# Patient Record
Sex: Male | Born: 1950 | Race: Black or African American | Hispanic: No | Marital: Single | Smoking: Current every day smoker
Health system: Southern US, Community
[De-identification: ages and names within clinical notes are randomized; demographics above are authoritative.]

## PROBLEM LIST (undated history)

## (undated) ENCOUNTER — Emergency Department (HOSPITAL_COMMUNITY): Admission: EM | Payer: Medicare Other | Source: Home / Self Care

## (undated) DIAGNOSIS — I10 Essential (primary) hypertension: Secondary | ICD-10-CM

## (undated) DIAGNOSIS — S31139A Puncture wound of abdominal wall without foreign body, unspecified quadrant without penetration into peritoneal cavity, initial encounter: Secondary | ICD-10-CM

## (undated) DIAGNOSIS — R42 Dizziness and giddiness: Secondary | ICD-10-CM

## (undated) DIAGNOSIS — T4145XA Adverse effect of unspecified anesthetic, initial encounter: Secondary | ICD-10-CM

## (undated) DIAGNOSIS — I2510 Atherosclerotic heart disease of native coronary artery without angina pectoris: Secondary | ICD-10-CM

## (undated) DIAGNOSIS — E785 Hyperlipidemia, unspecified: Secondary | ICD-10-CM

## (undated) DIAGNOSIS — Z720 Tobacco use: Secondary | ICD-10-CM

## (undated) DIAGNOSIS — T8859XA Other complications of anesthesia, initial encounter: Secondary | ICD-10-CM

## (undated) DIAGNOSIS — Z9889 Other specified postprocedural states: Secondary | ICD-10-CM

## (undated) DIAGNOSIS — F0390 Unspecified dementia without behavioral disturbance: Secondary | ICD-10-CM

## (undated) DIAGNOSIS — R0602 Shortness of breath: Secondary | ICD-10-CM

## (undated) DIAGNOSIS — I252 Old myocardial infarction: Secondary | ICD-10-CM

## (undated) DIAGNOSIS — W3400XA Accidental discharge from unspecified firearms or gun, initial encounter: Secondary | ICD-10-CM

## (undated) DIAGNOSIS — R112 Nausea with vomiting, unspecified: Secondary | ICD-10-CM

## (undated) HISTORY — DX: Dizziness and giddiness: R42

## (undated) HISTORY — DX: Unspecified dementia, unspecified severity, without behavioral disturbance, psychotic disturbance, mood disturbance, and anxiety: F03.90

## (undated) HISTORY — DX: Tobacco use: Z72.0

## (undated) HISTORY — DX: Old myocardial infarction: I25.2

## (undated) HISTORY — DX: Hyperlipidemia, unspecified: E78.5

## (undated) HISTORY — DX: Accidental discharge from unspecified firearms or gun, initial encounter: W34.00XA

## (undated) HISTORY — DX: Puncture wound of abdominal wall without foreign body, unspecified quadrant without penetration into peritoneal cavity, initial encounter: S31.139A

---

## 1971-08-07 HISTORY — PX: ABDOMINAL SURGERY: SHX537

## 1976-08-06 HISTORY — PX: TIBIA FRACTURE SURGERY: SHX806

## 1997-12-17 ENCOUNTER — Other Ambulatory Visit: Admission: RE | Admit: 1997-12-17 | Discharge: 1997-12-17 | Payer: Self-pay

## 1997-12-31 ENCOUNTER — Emergency Department (HOSPITAL_COMMUNITY): Admission: EM | Admit: 1997-12-31 | Discharge: 1997-12-31 | Payer: Self-pay | Admitting: Emergency Medicine

## 1998-01-27 ENCOUNTER — Emergency Department (HOSPITAL_COMMUNITY): Admission: EM | Admit: 1998-01-27 | Discharge: 1998-01-27 | Payer: Self-pay | Admitting: Emergency Medicine

## 1998-06-13 ENCOUNTER — Emergency Department (HOSPITAL_COMMUNITY): Admission: EM | Admit: 1998-06-13 | Discharge: 1998-06-13 | Payer: Self-pay | Admitting: Emergency Medicine

## 1998-06-27 ENCOUNTER — Encounter: Admission: RE | Admit: 1998-06-27 | Discharge: 1998-06-27 | Payer: Self-pay | Admitting: Internal Medicine

## 1999-09-26 ENCOUNTER — Encounter: Admission: RE | Admit: 1999-09-26 | Discharge: 1999-09-26 | Payer: Self-pay | Admitting: Internal Medicine

## 2000-03-03 ENCOUNTER — Emergency Department (HOSPITAL_COMMUNITY): Admission: EM | Admit: 2000-03-03 | Discharge: 2000-03-03 | Payer: Self-pay | Admitting: Emergency Medicine

## 2000-03-04 ENCOUNTER — Encounter: Admission: RE | Admit: 2000-03-04 | Discharge: 2000-03-04 | Payer: Self-pay | Admitting: Internal Medicine

## 2000-08-22 ENCOUNTER — Encounter: Admission: RE | Admit: 2000-08-22 | Discharge: 2000-08-22 | Payer: Self-pay | Admitting: Hematology and Oncology

## 2000-08-22 ENCOUNTER — Ambulatory Visit (HOSPITAL_COMMUNITY): Admission: RE | Admit: 2000-08-22 | Discharge: 2000-08-22 | Payer: Self-pay | Admitting: Hematology and Oncology

## 2002-01-19 ENCOUNTER — Ambulatory Visit (HOSPITAL_COMMUNITY): Admission: RE | Admit: 2002-01-19 | Discharge: 2002-01-19 | Payer: Self-pay | Admitting: Family Medicine

## 2002-01-19 ENCOUNTER — Encounter: Payer: Self-pay | Admitting: Family Medicine

## 2003-04-15 ENCOUNTER — Emergency Department (HOSPITAL_COMMUNITY): Admission: EM | Admit: 2003-04-15 | Discharge: 2003-04-15 | Payer: Self-pay | Admitting: Emergency Medicine

## 2003-09-29 ENCOUNTER — Ambulatory Visit (HOSPITAL_COMMUNITY): Admission: RE | Admit: 2003-09-29 | Discharge: 2003-09-29 | Payer: Self-pay | Admitting: Family Medicine

## 2003-10-11 ENCOUNTER — Emergency Department (HOSPITAL_COMMUNITY): Admission: EM | Admit: 2003-10-11 | Discharge: 2003-10-11 | Payer: Self-pay | Admitting: Emergency Medicine

## 2004-05-05 ENCOUNTER — Ambulatory Visit: Payer: Self-pay | Admitting: Family Medicine

## 2004-11-30 IMAGING — CR DG CHEST 2V
2 series · 2 of 2 positions shown · non-contrast
Comparison: none

CLINICAL DATA: Chest pain.  
 CHEST, TWO VIEWS 
 PA and lateral views of the chest dated 10/11/03 are compared with the prior study of 09/29/03.  The heart remains normal in size.  The pulmonary vascularity is felt to be within normal limits, and the lung fields are clear.  Slight eventration of the right hemidiaphragm is suggested.

[view not recorded (1 of 2)]
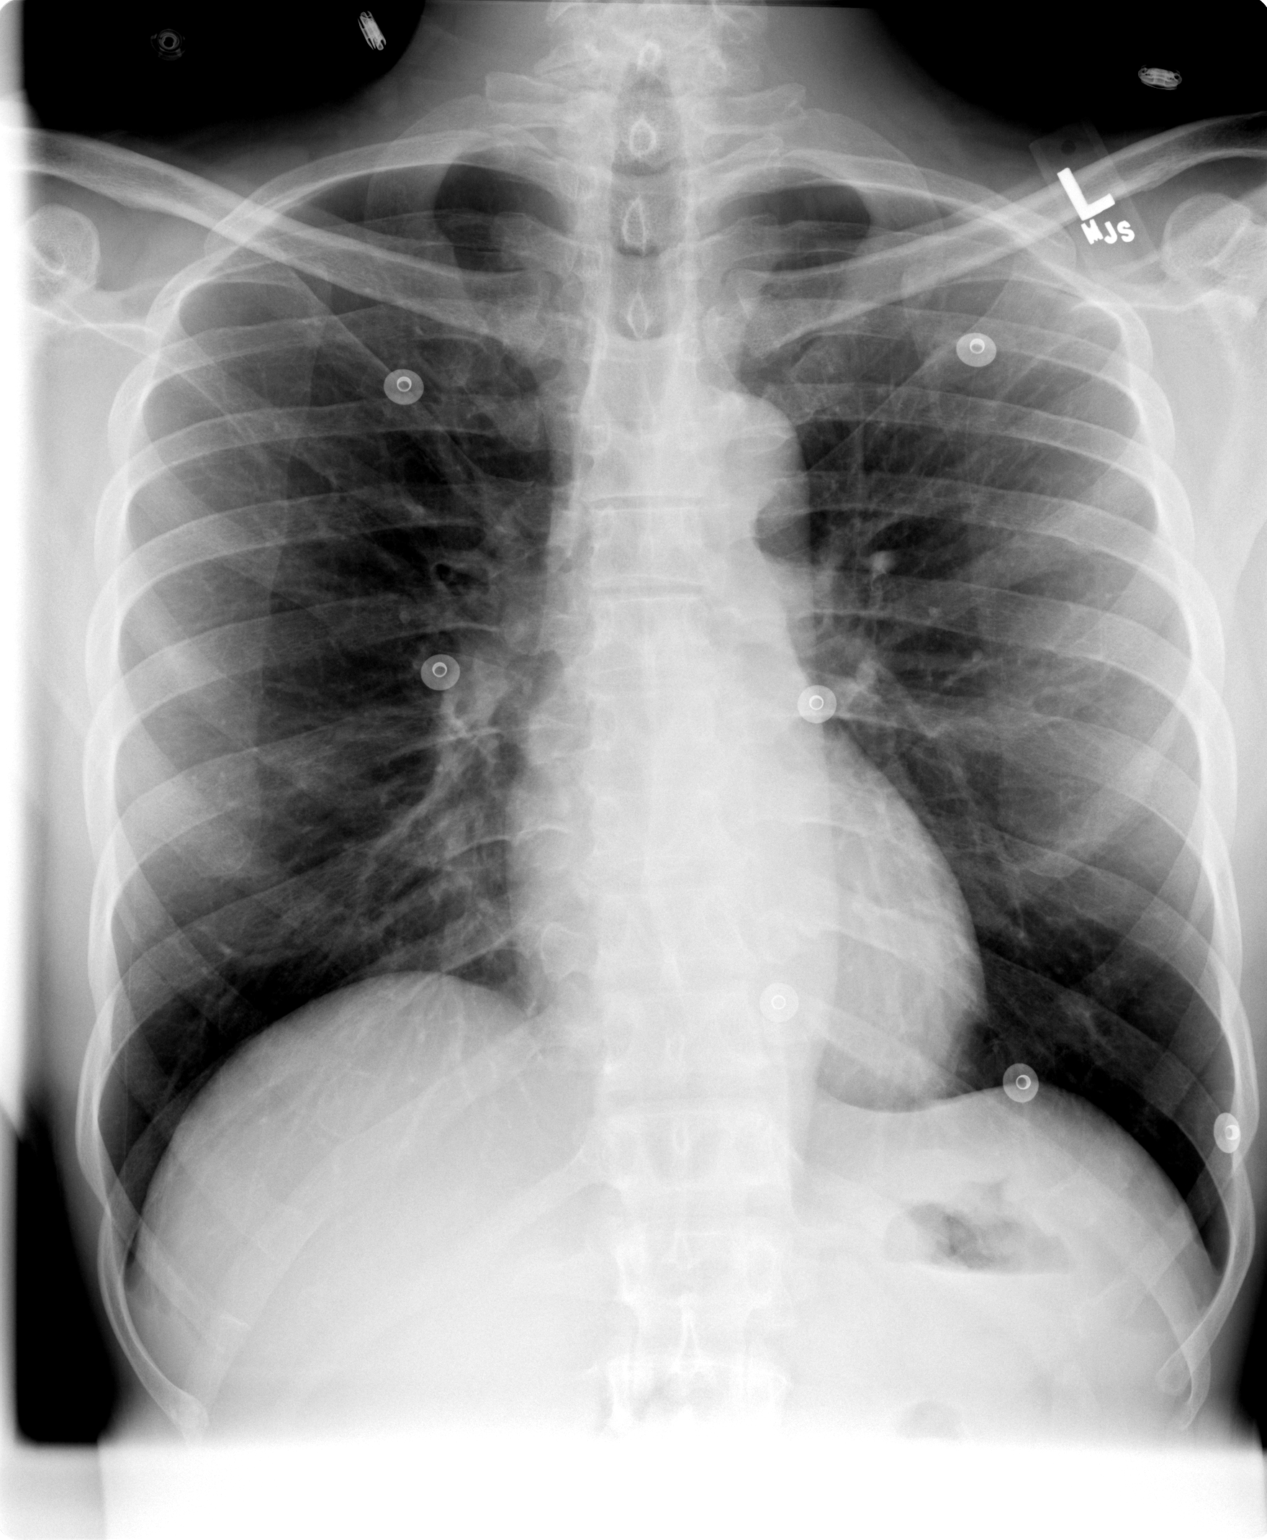

[view not recorded (2 of 2)]
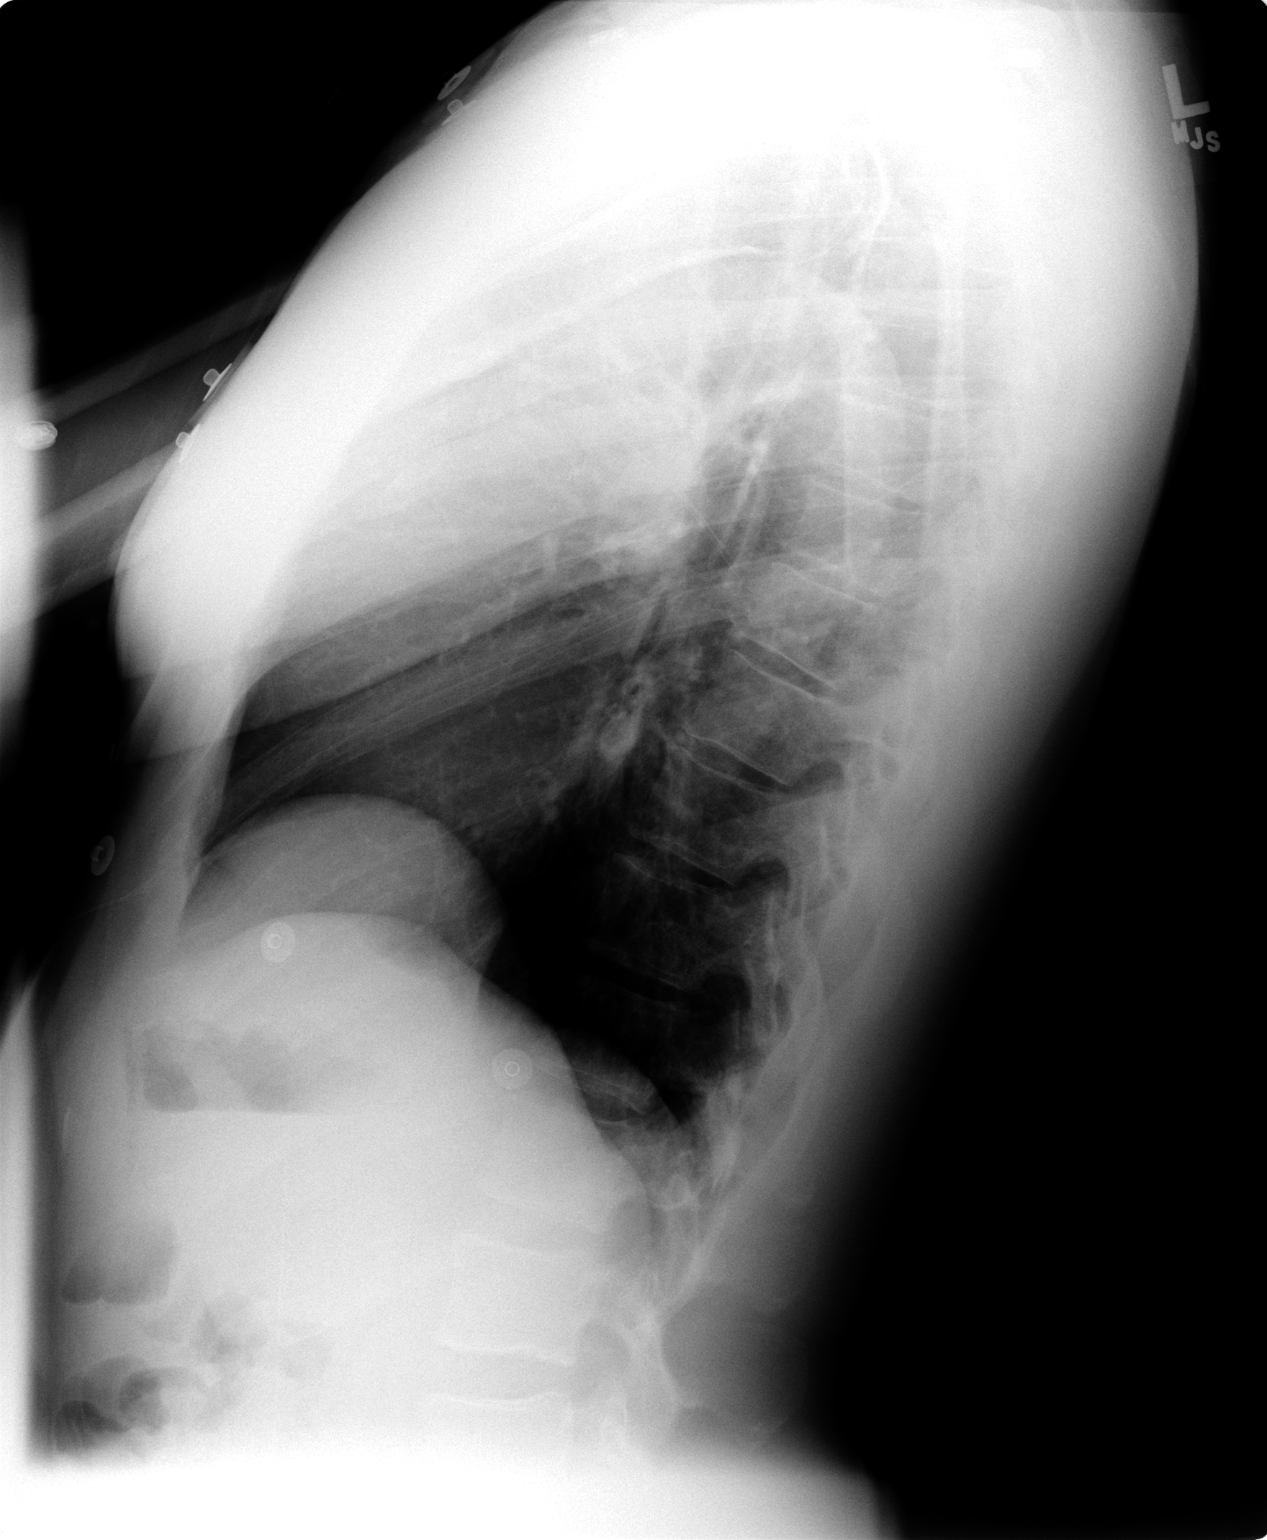

[2 of 2 positions shown; findings below may reference images not displayed]

IMPRESSION: Negative chest for active disease without significant interval change since the prior study.

## 2004-12-07 ENCOUNTER — Ambulatory Visit: Payer: Self-pay | Admitting: Family Medicine

## 2004-12-13 ENCOUNTER — Ambulatory Visit (HOSPITAL_COMMUNITY): Admission: RE | Admit: 2004-12-13 | Discharge: 2004-12-13 | Payer: Self-pay | Admitting: Family Medicine

## 2005-04-20 ENCOUNTER — Ambulatory Visit: Payer: Self-pay | Admitting: Family Medicine

## 2005-04-27 ENCOUNTER — Ambulatory Visit: Payer: Self-pay | Admitting: Family Medicine

## 2005-05-01 ENCOUNTER — Ambulatory Visit: Payer: Self-pay | Admitting: Family Medicine

## 2006-03-05 ENCOUNTER — Ambulatory Visit: Payer: Self-pay | Admitting: Family Medicine

## 2006-03-06 ENCOUNTER — Ambulatory Visit (HOSPITAL_COMMUNITY): Admission: RE | Admit: 2006-03-06 | Discharge: 2006-03-06 | Payer: Self-pay | Admitting: Family Medicine

## 2006-03-19 ENCOUNTER — Ambulatory Visit: Payer: Self-pay | Admitting: Family Medicine

## 2007-10-05 DIAGNOSIS — I252 Old myocardial infarction: Secondary | ICD-10-CM

## 2007-10-05 HISTORY — PX: CORONARY ANGIOPLASTY WITH STENT PLACEMENT: SHX49

## 2007-10-05 HISTORY — DX: Old myocardial infarction: I25.2

## 2007-10-23 ENCOUNTER — Inpatient Hospital Stay (HOSPITAL_COMMUNITY): Admission: AD | Admit: 2007-10-23 | Discharge: 2007-10-27 | Payer: Self-pay | Admitting: Cardiology

## 2007-10-24 ENCOUNTER — Encounter: Payer: Self-pay | Admitting: Cardiology

## 2007-10-24 ENCOUNTER — Encounter: Payer: Self-pay | Admitting: Cardiothoracic Surgery

## 2007-10-27 ENCOUNTER — Encounter: Payer: Self-pay | Admitting: Cardiothoracic Surgery

## 2007-10-27 ENCOUNTER — Ambulatory Visit: Payer: Self-pay | Admitting: Cardiothoracic Surgery

## 2007-12-04 ENCOUNTER — Ambulatory Visit: Payer: Self-pay | Admitting: Cardiothoracic Surgery

## 2007-12-05 HISTORY — PX: CORONARY ARTERY BYPASS GRAFT: SHX141

## 2007-12-11 ENCOUNTER — Ambulatory Visit (HOSPITAL_COMMUNITY): Admission: RE | Admit: 2007-12-11 | Discharge: 2007-12-11 | Payer: Self-pay | Admitting: Cardiothoracic Surgery

## 2007-12-17 ENCOUNTER — Inpatient Hospital Stay (HOSPITAL_COMMUNITY): Admission: RE | Admit: 2007-12-17 | Discharge: 2007-12-21 | Payer: Self-pay | Admitting: Cardiothoracic Surgery

## 2007-12-17 ENCOUNTER — Ambulatory Visit: Payer: Self-pay | Admitting: Cardiothoracic Surgery

## 2007-12-30 ENCOUNTER — Emergency Department (HOSPITAL_COMMUNITY): Admission: EM | Admit: 2007-12-30 | Discharge: 2007-12-30 | Payer: Self-pay | Admitting: Emergency Medicine

## 2008-03-04 ENCOUNTER — Encounter: Admission: RE | Admit: 2008-03-04 | Discharge: 2008-03-04 | Payer: Self-pay | Admitting: Cardiothoracic Surgery

## 2008-03-04 ENCOUNTER — Ambulatory Visit: Payer: Self-pay | Admitting: Cardiothoracic Surgery

## 2009-02-05 IMAGING — CR DG CHEST 1V PORT
1 series · 1 of 1 positions shown · non-contrast
Comparison: 12/15/2007 preoperative CXR

CLINICAL DATA: CAD.  CABG.

PORTABLE CHEST - 1 VIEW

[view not recorded]
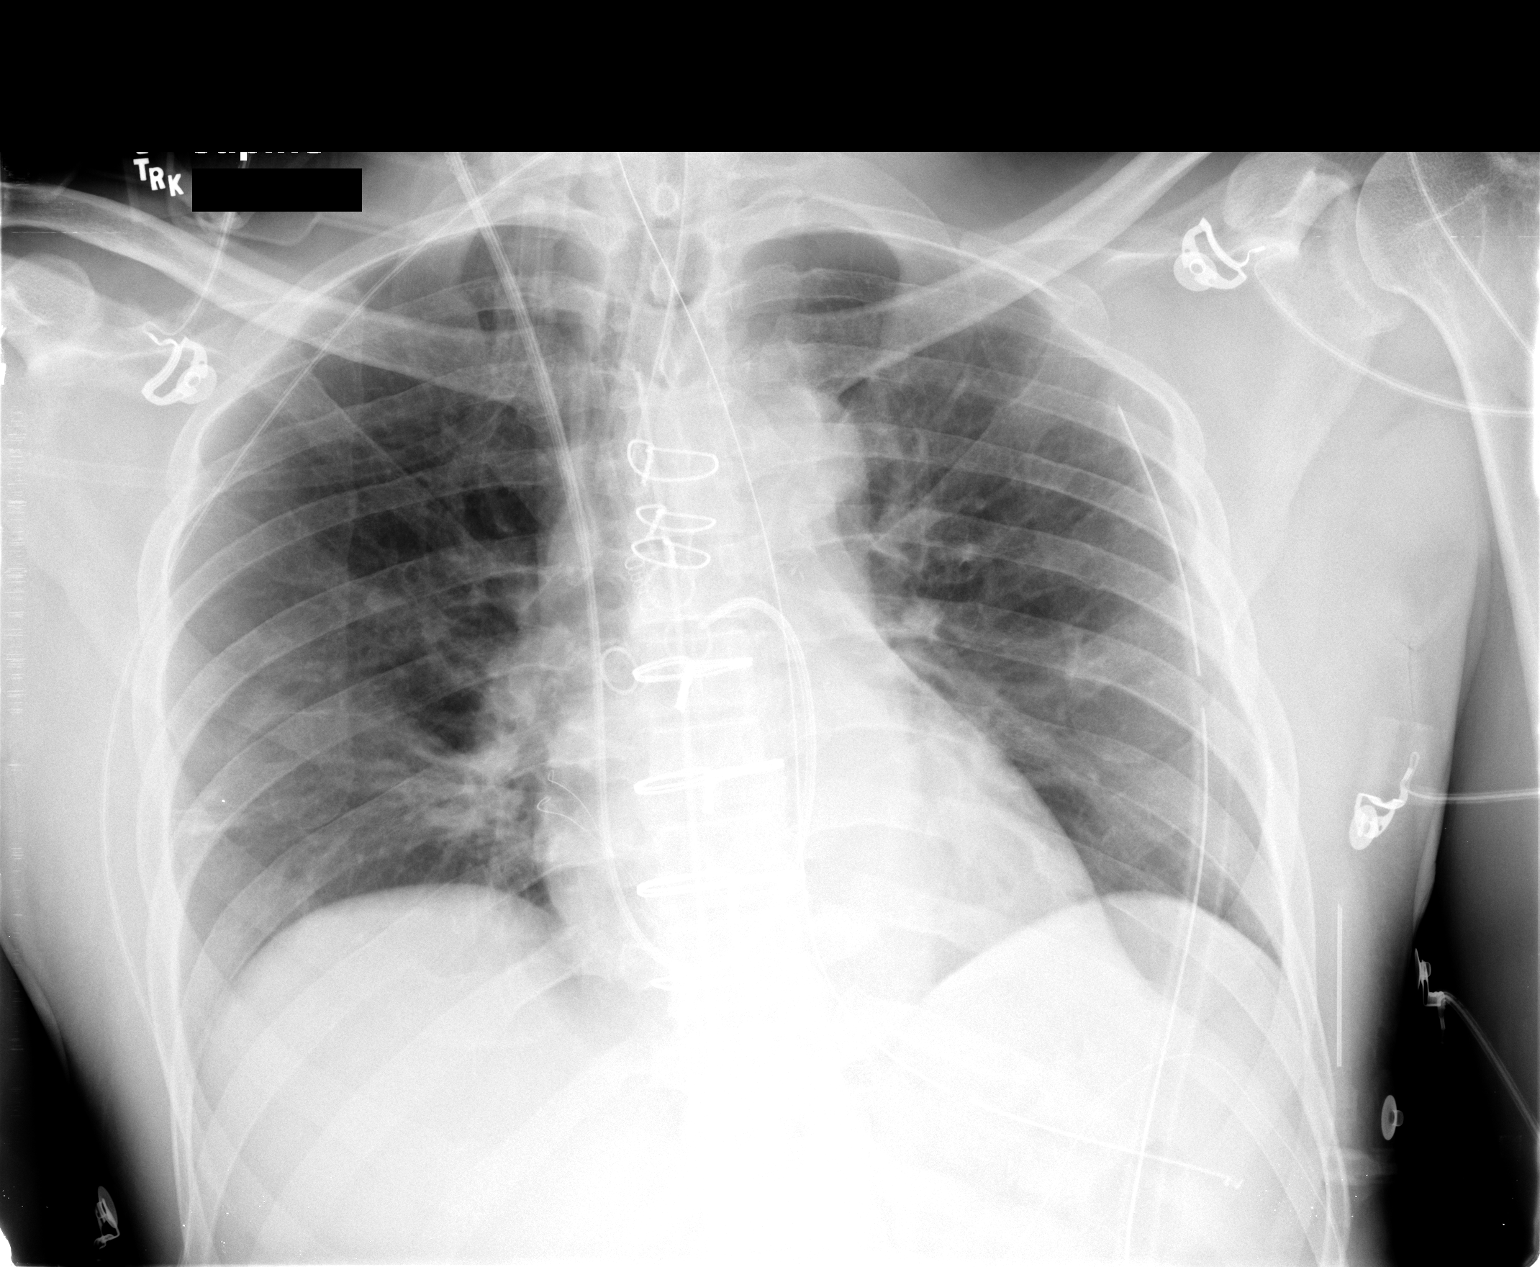

[1 of 1 positions shown; findings below may reference images not displayed]

FINDINGS: Sternal wire sutures and mediastinal clips.  Satisfactory
ET tube position.  SGC enters via right jugular vein approach.  The
tip of the catheter is in the right main pulmonary artery.
Mediastinal and left pleural chest tubes in situ.  NG tube tip is
in the fundus of the stomach.  Subsegmental atelectasis in the
lower lung zones.  No pneumothorax.  Normal cardiomediastinal
silhouette for this single view.
IMPRESSION: Satisfactory post CABG CXR.  Mild subsegmental atelectasis.

## 2009-02-08 IMAGING — CR DG CHEST 2V
2 series · 2 of 2 positions shown · non-contrast
Comparison: 12/19/2007

CLINICAL DATA: Post CABG

CHEST - 2 VIEW

[w chest pa]
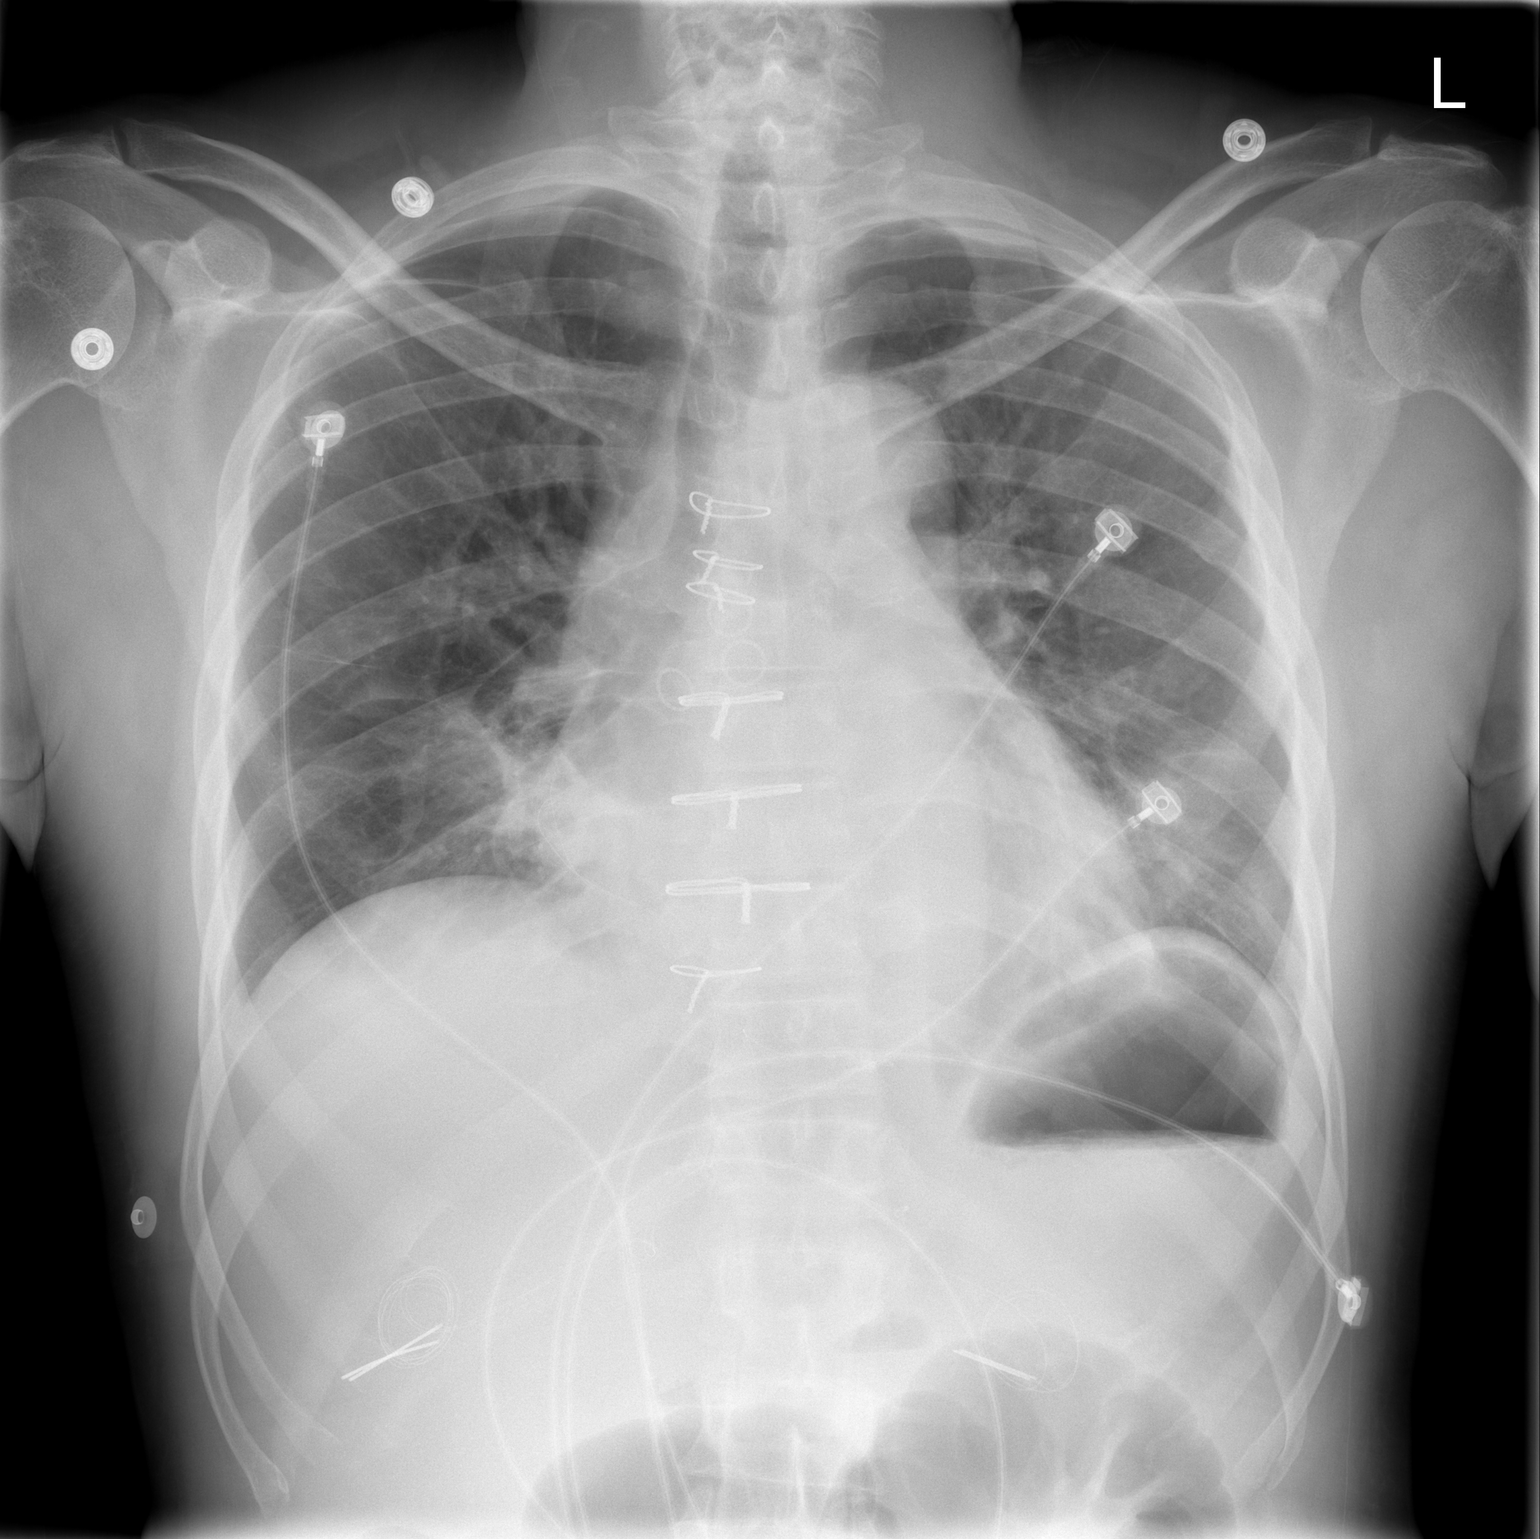

[w chest lat]
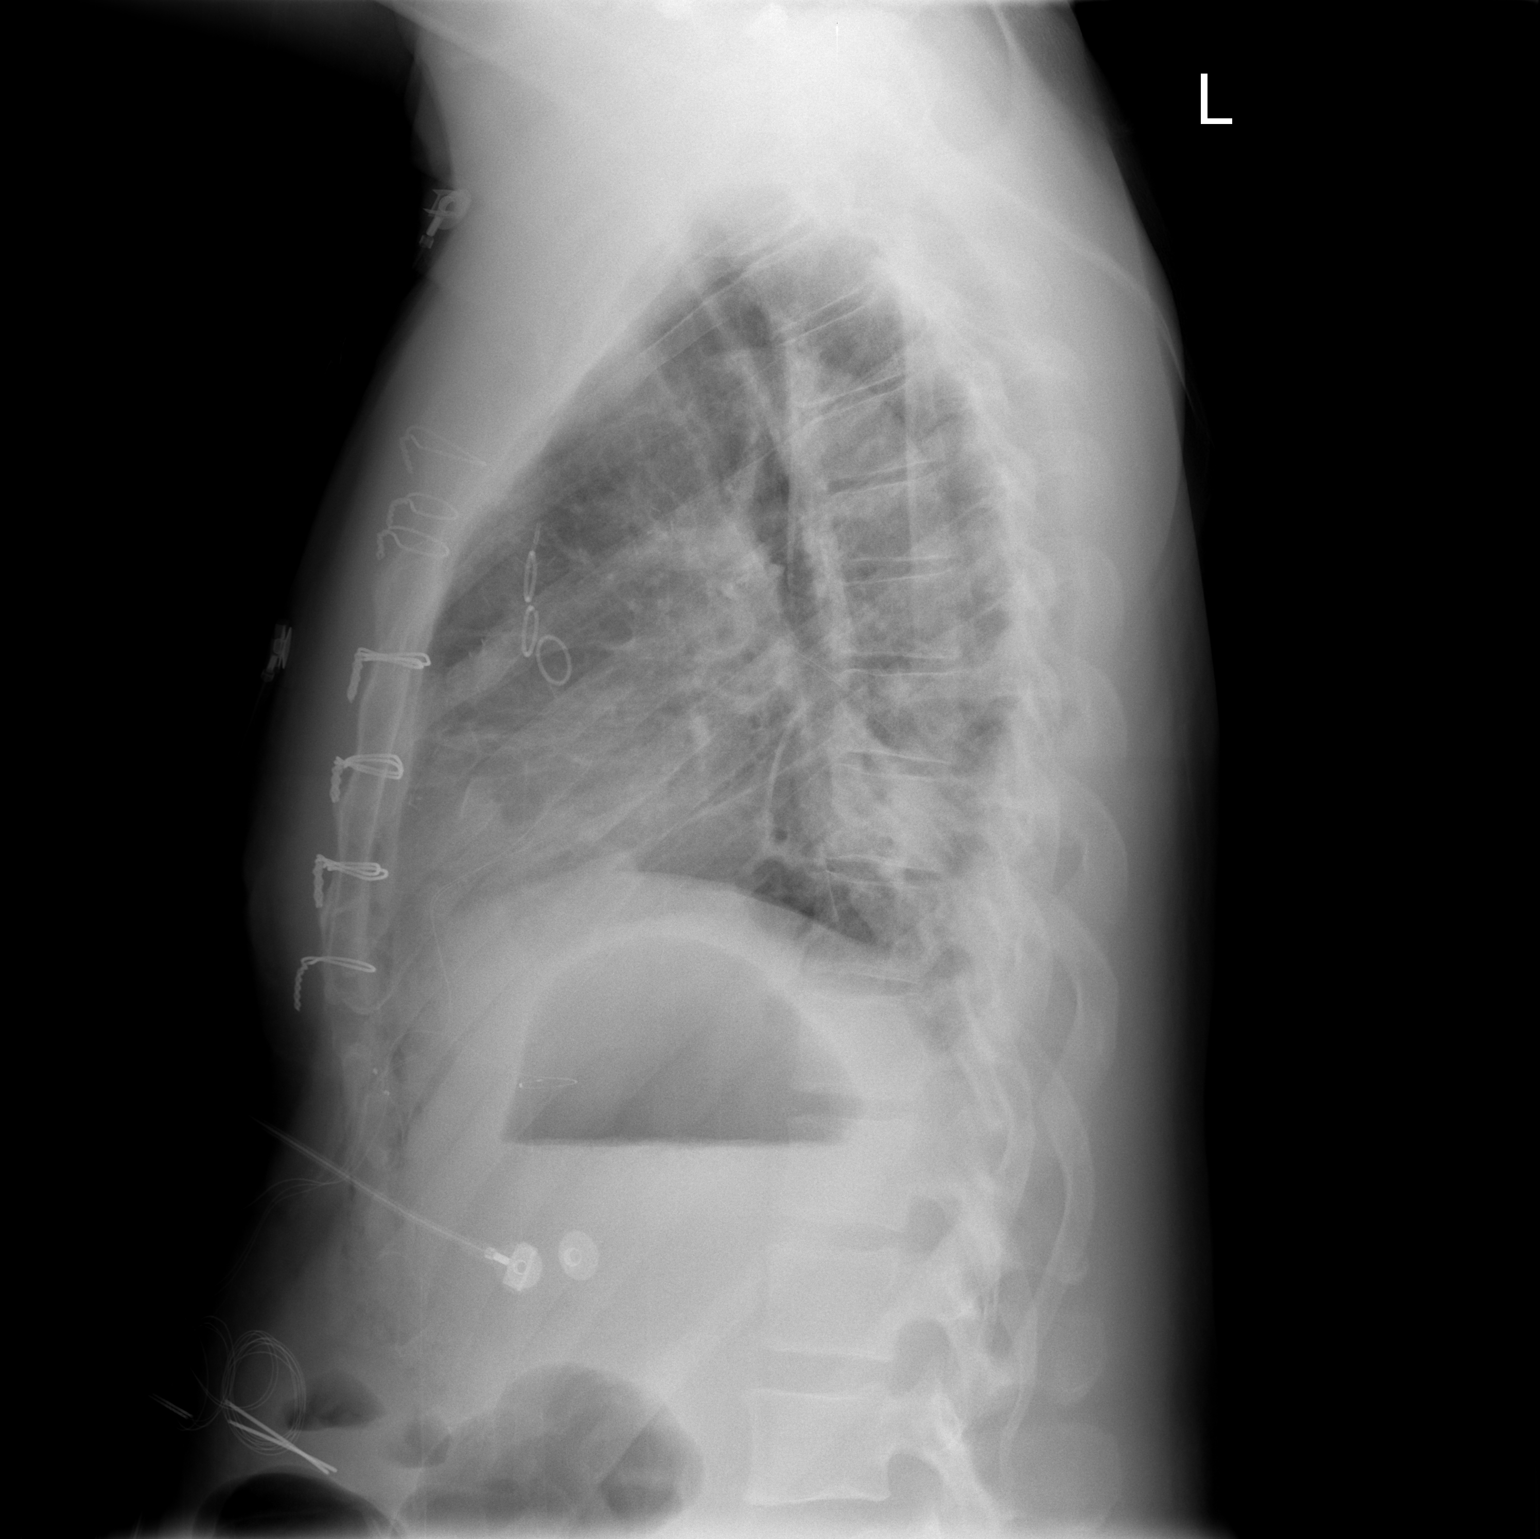

[2 of 2 positions shown; findings below may reference images not displayed]

FINDINGS: Right subclavian sheath has been pulled in the interval.
The left chest tube is now out.  There is no evidence for
pneumothorax.  Atelectasis is seen in the lung bases bilaterally.
Lungs are better expanded than on the prior film.  No substantial
pleural effusion.
IMPRESSION: Cardiomegaly with bibasilar atelectasis.

Improved lung expansion since yesterday's film.

## 2009-09-12 ENCOUNTER — Emergency Department (HOSPITAL_COMMUNITY): Admission: EM | Admit: 2009-09-12 | Discharge: 2009-09-12 | Payer: Self-pay | Admitting: Emergency Medicine

## 2010-03-19 ENCOUNTER — Emergency Department (HOSPITAL_COMMUNITY): Admission: EM | Admit: 2010-03-19 | Discharge: 2010-03-19 | Payer: Self-pay | Admitting: Emergency Medicine

## 2010-08-27 ENCOUNTER — Encounter: Payer: Self-pay | Admitting: Cardiothoracic Surgery

## 2010-10-22 ENCOUNTER — Emergency Department (HOSPITAL_COMMUNITY): Payer: PRIVATE HEALTH INSURANCE

## 2010-10-22 ENCOUNTER — Emergency Department (HOSPITAL_COMMUNITY)
Admission: EM | Admit: 2010-10-22 | Discharge: 2010-10-22 | Disposition: A | Discharge status: Home / Self Care | Payer: PRIVATE HEALTH INSURANCE | Attending: Emergency Medicine | Admitting: Emergency Medicine

## 2010-10-22 DIAGNOSIS — E785 Hyperlipidemia, unspecified: Secondary | ICD-10-CM | POA: Insufficient documentation

## 2010-10-22 DIAGNOSIS — R0789 Other chest pain: Secondary | ICD-10-CM | POA: Insufficient documentation

## 2010-10-22 DIAGNOSIS — Z951 Presence of aortocoronary bypass graft: Secondary | ICD-10-CM | POA: Insufficient documentation

## 2010-10-22 DIAGNOSIS — E119 Type 2 diabetes mellitus without complications: Secondary | ICD-10-CM | POA: Insufficient documentation

## 2010-10-22 DIAGNOSIS — I10 Essential (primary) hypertension: Secondary | ICD-10-CM | POA: Insufficient documentation

## 2010-10-22 DIAGNOSIS — I440 Atrioventricular block, first degree: Secondary | ICD-10-CM | POA: Insufficient documentation

## 2010-10-22 DIAGNOSIS — I2510 Atherosclerotic heart disease of native coronary artery without angina pectoris: Secondary | ICD-10-CM | POA: Insufficient documentation

## 2010-10-22 LAB — POCT CARDIAC MARKERS
Myoglobin, poc: 188 ng/mL (ref 12–200)
Troponin i, poc: <0.05 ng/mL (ref 0.00–0.09)

## 2010-10-22 LAB — BASIC METABOLIC PANEL
Calcium: 8.5 mg/dL (ref 8.4–10.5)
Chloride: 106 mEq/L (ref 96–112)
Sodium: 137 mEq/L (ref 135–145)

## 2010-10-22 LAB — CBC
Hemoglobin: 14.4 g/dL (ref 13.0–17.0)
MCH: 31.4 pg (ref 26.0–34.0)
Platelets: 157 10*3/uL (ref 150–400)
RBC: 4.58 MIL/uL (ref 4.22–5.81)
RDW: 13.5 % (ref 11.5–15.5)
WBC: 4.7 10*3/uL (ref 4.0–10.5)

## 2010-10-22 LAB — RAPID URINE DRUG SCREEN, HOSP PERFORMED
Benzodiazepines: NOT DETECTED
Opiates: NOT DETECTED
Tetrahydrocannabinol: POSITIVE — AB
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
## 2010-12-19 NOTE — Op Note (Signed)
NAMEKELTIN, BAIRD NO.:  [id number]   MEDICAL RECORD NO.:  [id number]          PATIENT TYPE:  INP   LOCATION:  2016                         FACILITY:  [facility]   PHYSICIAN:  [name], MD    DATE OF BIRTH:  November 13, 1950   DATE OF PROCEDURE:  12/17/2007  DATE OF DISCHARGE:                               OPERATIVE REPORT   PREOPERATIVE DIAGNOSES:  Coronary occlusive disease with history of a  bare metal stent placed in the mid right coronary artery for acute  myocardial infarction with residual disease.   POSTOPERATIVE DIAGNOSES:  Coronary occlusive disease with history of a  bare metal stent placed in the mid right coronary artery for acute  myocardial infarction with residual disease.   SURGICAL PROCEDURES:  Coronary artery bypass grafting x4 with the left  internal mammary to the left anterior descending coronary artery,  reverse saphenous vein graft to the diagonal coronary artery, reverse  saphenous vein graft to the circumflex coronary artery, reverse  saphenous vein graft to the posterior descending coronary artery with  Endovein harvesting both thighs.   SURGEON:  Dr. Tyrone Sage.   FIRST ASSISTANT:  Lin Landsman, PA.   BRIEF HISTORY:  The patient is a 61 year old male who 5-6 weeks  preoperatively presented with an acute inferior myocardial infarction.  He underwent acute angioplasty and placed in a bare metal stent by Dr.  Peter Swaziland.  He was started on Plavix, which was maintained for 1-  month.  At the time of catheterization, he was also noted to have an 80%  mid posterior descending coronary artery lesion.  In addition, a very  high-grade, greater than 80% stenosis of the mid LAD involving the  diagonal.  He also had a 70% circumflex lesion.  Because of the nature  of his three-vessel coronary artery disease, he was seen as an  outpatient and encouraged to stop smoking, and then elective coronary  artery bypass grafting was recommended  after a safe period to stop his  Plavix because of the recently placed bare metal stent.  The patient  agreed and signed informed consent.   DESCRIPTION OF PROCEDURE:  With Swan-Ganz and arterial line monitors in  place, the patient underwent general endotracheal anesthesia without  incident.  The skin of the chest and legs were prepped with Betadine and  draped in the usual sterile manner.  Using the [employer] Endovein  harvesting system, vein was harvested from the right thigh.  At the  knee, the vein became small.  Additional vein was harvested from the  left thigh with the [employer] Endovein harvest system and was also of good  quality and caliber.  A median sternotomy was performed.  The left  internal mammary artery was dissected down as a pedicle graft.  The  distal artery was divided and had good free flow.  The pericardium was  opened.  Overall ventricular function appeared preserved.  He was  systemically heparinized.  The ascending aorta was cannulated and aortic  root.  Cardioplegia was introduced into the ascending aorta.  The  patient was placed on  cardiopulmonary bypass at 2.4 liters per minute  per meter squared.  Sites for anastomosis were selected and dissected  out of the epicardium.  The patient's body temperature was cooled to 30  degrees.  Aortic crossclamp was applied.  Five-hundred mL of cold blood  potassium cardioplegia was administered antegrade into the aortic root  with diastolic arrest of the heart.  Myocardial septal temperatures were  monitored throughout the crossclamp.  Attention was turned first to the  posterior descending coronary artery.  In the midportion of the vessel,  I passed a significant amount of calcification.  Then the proximal  posterior descending vessel was opened and admitted a 1.5 mm probe  distally.  Using a running 7-0 Prolene, a distal anastomosis was  performed to the posterior descending with a second reverse saphenous  vein graft.   The heart was elevated and the distal circumflex was  identified and the vessel was opened and admitted a 1.5-mm probe.  Using  a running 7-0 Prolene, distal anastomosis was performed with segment of  reversed saphenous vein graft.  The diagonal coronary artery was then  opened.  It was slightly smaller, admitted a 1.3-1.4 mm in size.  Using  a running 7-0 Prolene, distal anastomosis was performed with a segment  of reversed saphenous vein graft.  Attention was then turned to left  anterior descending coronary artery which was opened in the midportion.  Using a running 8-0 Prolene, the left internal mammary artery was  anastomosed to the left anterior descending coronary artery.  With  release of the bulldog on the mammary artery, there was rise in  myocardial septal temperature.  A bulldog was placed back on the mammary  artery.  Additional cold blood cardioplegia was administered with the  crossclamp still in place.  Three punch aortotomies were performed on  the ascending aorta and each of three vein grafts were anastomosed to  the ascending aorta.  Air was evacuated from the grafts and the aortic  crossclamp was removed.  Total crossclamp time was 100 minutes.  The  patient spontaneously converted to a sinus rhythm.  The sites of  anastomosis were inspected and were free of bleeding.  The patient was  then ventilated and weaned from cardiopulmonary bypass without  difficulty.  Total pump time was 130 minutes.  He was decannulated in  the usual fashion.  Protamine sulfate was administered with operative  field hemostatic.  Two atrial and two ventricular pacing wires applied.  Graft marks were applied.  The left pleural tube was placed.  The  pericardium was reapproximated.  The sternum closed with #6 stainless  steel wire over a single mediastinal Blake drain.  The fascia was closed  with interrupted 0-Vicryl, running 3-0 Vicryl in the subcutaneous  tissue, 4-0 subcuticular stitch in  the skin edges.  Dry dressings were  applied.  Sponge and needle count was reported as correct at the  completion of the procedure.  The patient tolerated the procedure  without obvious complication and was transferred to surgical intensive  care unit for further postoperative care.      [name], MD  Electronically Signed     EG/MEDQ  D:  12/19/2007  T:  12/19/2007  Job:  [id number]   cc:   [name], M.D.

## 2010-12-19 NOTE — Assessment & Plan Note (Signed)
OFFICE VISIT   OTHELLO, DICKENSON  DOB:  1951-01-27                                        March 04, 2008  CHART #:  [id number]   HISTORY OF PRESENT ILLNESS:  The patient is status post coronary artery  bypass grafting x4 done by Dr. Tyrone Sage on Dec 17, 2007.  The patient's  postoperative course is pretty much stable, but the patient signed out  AMA on Dec 21, 2007.  He did receive instructions and discharge  medications at the time of sign out.  The patient presents today for his  first followup visit since surgery, Dec 17, 2007.  The patient states he  is feeling better.  He is up ambulating with a cane at least twice a day  around his house.  He then states that his incisional pain has improved.  He denies any shortness of breath.  The patient states that a nurse  comes out once a week and was told that he needed to stop his  simvastatin due to it was not helping him.  The patient has not seen Dr.  Swaziland postop.  He denies any shortness of breath and opening or  drainage from any of his incision sites.   PHYSICAL EXAMINATION:  VITAL SIGNS:  Blood pressure of 150/108, pulse of  88, respirations of 18, and O2 sat 98% on room air.  GENERAL:  The patient is ambulating with a cane.  He is doing much  better at this time than he was preoperatively, which he was in a  wheelchair last time he was in the office.  RESPIRATORY:  Clear to auscultation bilaterally.  CARDIAC:  Regular rate and rhythm.  Sternum is stable.  ABDOMEN:  Benign.  EXTREMITIES:  No edema noted.  Warm and well perfused.  Incisions are  healing well.   STUDIES:  The patient had a PA and lateral chest x-ray done today, March 04, 2008, which is clear.  She has no signs of effusion, atelectasis, or  pneumothorax.   IMPRESSION AND PLAN:  The patient is status post coronary artery bypass  grafting done by Dr. Tyrone Sage on Dec 17, 2007.  The patient is  progressing well except for hypertension.  He  brought 2 pieces of paper  stating that [facility] nurse had been in to evaluate him and multiple times  blood pressure taken and noted to be in the 160s to 180s systolic with a  diastolic of 191Y to 120s.  We contacted Dr. Elvis Coil office, scheduled  an appointment with him for the patient for tomorrow, but it appears  that an appointment has ready been scheduled and the patient is to see  Dr. Swaziland in the a.m. at 11 o'clock.  The patient is told to continue  ambulating as tolerated 3 to 4 times per day using his cane.  He is told  to slowly increase his weightbearing activity.  We will keep the patient  on current medications and Dr. Swaziland to evaluate increase in blood  pressure medications in the a.m.  We will release the patient from the  office and he is told to contact us with any surgical issues.   [name], MD  Electronically Signed   KMD/MEDQ  D:  03/04/2008  T:  03/04/2008  Job:  [id number]   cc:   Theron Arista  M. Swaziland, M.D.  [name], MD
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
## 2010-12-19 NOTE — Cardiovascular Report (Signed)
NAMEATWOOD, ADCOCK NO.:  [id number]   MEDICAL RECORD NO.:  [id number]          PATIENT TYPE:  OIB   LOCATION:  2807                         FACILITY:  [facility]   PHYSICIAN:  [name], M.D.  DATE OF BIRTH:  12/14/1950   DATE OF PROCEDURE:  10/23/2007  DATE OF DISCHARGE:                            CARDIAC CATHETERIZATION   INDICATIONS FOR PROCEDURE:  A 60 year old black male with a history of  hypertension and family history of cardiac disease presents with acute  onset of substernal chest pain today.  ECG shows marked ST elevation in  the inferolateral leads, II, III, AVF, V5 and V6.  He has bradycardia  with second-degree heart block.  The patient has no prior history of  myocardial infarction.   PROCEDURE:  Left heart catheterization, coronary and left ventricular  angiography, intracoronary stenting of the mid right coronary.   ACCESS:  The patient was unable to have a peripheral IV access obtained.  We therefore placed a venous sheath in the right femoral vein by  standard Seldinger technique.  He also had a 6-French arterial sheath  placed in the right femoral artery via the standard Seldinger technique.   EQUIPMENT:  6-French 4 cm right and left Judkins catheter, 6-French  pigtail catheter, 6-French arterial sheath, a 3.0 x 20 mm Maverick  balloon, a 3.5 x 24 mm Liberte stent and a 3.5 x 15 mm Quantum Maverick  balloon.   MEDICATIONS:  Heparin bolus of 4300 units with subsequent ACT of 338.  Nitroglycerin 200 mcg intracoronary x1, Integrilin bolus at 180 mcg/kg  followed by continuous infusion of 2 mcg/kg per minute, Plavix 600 mg  orally.   HEMODYNAMIC DATA:  Aortic pressure is 127/89 with a mean of 106 mmHg.  Left ventricle pressure is 114 with EDP of 6 mmHg.   CONTRAST:  A total of 175 mL of Omnipaque.   ANGIOGRAPHIC DATA:  The left coronary arises and distributes normally.  The left main coronary artery appears normal.   The left  anterior descending artery is a large vessel which extends out  around the apex.  It gives rise to two high diagonal branches.  The  first diagonal branch is small.  The second diagonal branch is moderate  to large.  There is a 90% bifurcation stenosis in the proximal LAD at  the bifurcation and involving the origin of the second diagonal branch.  There is then a focal 70% stenosis in the mid-LAD.   The left circumflex coronary has scattered 40% lesions throughout the  proximal and mid vessel.   The right coronary arises and distributes normally.  It is a dominant  vessel and is occluded in the midvessel with TIMI grade 0 flow.   We proceeded at this point with emergent intervention of the right  coronary artery.  The patient was in second degree heart block during  the initial phase of this procedure.  The patient was hypertensive.  We  did give him intracoronary nitroglycerin x1.  We then crossed the lesion  with a wire without difficulty.  At this point  with reperfusion, the  patient became profoundly hypotensive and further bradycardic with heart  rates down the high 30s.  He responded promptly to IV atropine with  increased heart rate to 110 and stabilization of blood pressure.  We  then proceeded with predilatation of the right coronary lesion in the  mid vessel using a 3.0 x 20-mm Maverick balloon.  This was dilated at 6  and then 8 atmospheres.  We then stented the mid right coronary using a  3.5 x 24 mm Liberte stent deploying it at 9 and then 12 atmospheres with  a stent balloon.  We postdilated the stented segment using a 3.5 x 15 mm  Quantum Maverick balloon dilated up to 16 atmospheres x2.  This yielded  excellent angiographic result with 0% residual stenosis and TIMI grade 3  flow.  It was noted there was an 80-90% stenosis in the proximal PDA.  At this point the patient was pain free and hemodynamically stable.  His  ST elevation had resolved.  He was in sinus rhythm  without any evidence  of AV block.   Left ventricular angiography at this point was performed in the RAO  view.  This demonstrates normal left ventricular size and normal  contractility with ejection fraction of 65%.  There was no regional wall  motion abnormality.  There was no significant mitral insufficiency.   FINAL INTERPRETATION:  1. Severe two-vessel obstructive atherosclerotic coronary disease.  2. Normal left ventricular function.  3. Successful emergent intracoronary stenting of the mid right      coronary with a bare metal stent.           ______________________________  [name], M.D.     PMJ/MEDQ  D:  10/23/2007  T:  10/24/2007  Job:  [id number]   cc:   Tinnie Gens C. Quintella Reichert, M.D.

## 2011-04-20 ENCOUNTER — Other Ambulatory Visit: Payer: Self-pay | Admitting: *Deleted

## 2011-04-23 ENCOUNTER — Other Ambulatory Visit: Payer: Self-pay | Admitting: *Deleted

## 2011-04-23 MED ORDER — CLOPIDOGREL BISULFATE 75 MG PO TABS: 75.0000 mg | ORAL_TABLET | Freq: Every day | ORAL | Status: DC

## 2011-04-24 ENCOUNTER — Other Ambulatory Visit: Payer: Self-pay | Admitting: *Deleted

## 2011-04-24 MED ORDER — CLOPIDOGREL BISULFATE 75 MG PO TABS: 75.0000 mg | ORAL_TABLET | Freq: Every day | ORAL | Status: DC

## 2011-04-24 NOTE — Telephone Encounter (Signed)
Refilled Meds from fax  

## 2011-04-25 ENCOUNTER — Other Ambulatory Visit: Payer: Self-pay | Admitting: *Deleted

## 2011-04-25 MED ORDER — CLOPIDOGREL BISULFATE 75 MG PO TABS: 75.0000 mg | ORAL_TABLET | Freq: Every day | ORAL | Status: DC

## 2011-04-25 NOTE — Telephone Encounter (Signed)
escribe medication per fax request  

## 2011-04-30 LAB — COMPREHENSIVE METABOLIC PANEL
AST: 82 — ABNORMAL HIGH
Albumin: 3.4 — ABNORMAL LOW
Calcium: 8.9
Chloride: 106
Creatinine, Ser: 1
GFR calc Af Amer: >60
Total Bilirubin: 0.9
Total Protein: 6.7

## 2011-04-30 LAB — CBC
HCT: 42.3
Hemoglobin: 14.2
MCHC: 33.6
MCHC: 34.2
Platelets: 216
RDW: 13.9
RDW: 14.1

## 2011-04-30 LAB — HEMOGLOBIN A1C: Mean Plasma Glucose: 122

## 2011-04-30 LAB — I-STAT EC8
Bicarbonate: 20.4
Glucose, Bld: 133 — ABNORMAL HIGH
TCO2: 21
pH, Arterial: 7.386

## 2011-04-30 LAB — CARDIAC PANEL(CRET KIN+CKTOT+MB+TROPI)
CK, MB: 50.5 — ABNORMAL HIGH
CK, MB: 79.7 — ABNORMAL HIGH
Relative Index: 9.8 — ABNORMAL HIGH
Total CK: 456 — ABNORMAL HIGH
Total CK: 608 — ABNORMAL HIGH
Total CK: 810 — ABNORMAL HIGH
Troponin I: 12.34
Troponin I: 27.33
Troponin I: 8.88

## 2011-04-30 LAB — BASIC METABOLIC PANEL
BUN: 9
CO2: 28
Calcium: 9.1
Creatinine, Ser: 0.99
GFR calc non Af Amer: >60
Glucose, Bld: 81

## 2011-04-30 LAB — APTT: aPTT: 33

## 2011-04-30 LAB — LIPID PANEL
Cholesterol: 134
HDL: 30 — ABNORMAL LOW

## 2011-04-30 LAB — PROTIME-INR: INR: 1

## 2011-04-30 LAB — POCT I-STAT CREATININE: Operator id: 118911

## 2011-05-02 LAB — CBC
HCT: 30.9 — ABNORMAL LOW
HCT: 32.5 — ABNORMAL LOW
Hemoglobin: 10.7 — ABNORMAL LOW
Hemoglobin: 11 — ABNORMAL LOW
Hemoglobin: 11.8 — ABNORMAL LOW
MCHC: 33.3
MCHC: 33.8
MCHC: 34.7
MCV: 94.8
MCV: 95.4
Platelets: 72 — ABNORMAL LOW
Platelets: 74 — ABNORMAL LOW
RBC: 3.26 — ABNORMAL LOW
RBC: 3.41 — ABNORMAL LOW
RBC: 3.81 — ABNORMAL LOW
RDW: 14
RDW: 14.4
WBC: 6.4
WBC: 6.7
WBC: 7.4

## 2011-05-02 LAB — POCT CARDIAC MARKERS
CKMB, poc: 1.3
Myoglobin, poc: 125
Myoglobin, poc: >500
Operator id: 114141
Troponin i, poc: <0.05

## 2011-05-02 LAB — BASIC METABOLIC PANEL
BUN: 11
BUN: 8
CO2: 28
CO2: 29
Calcium: 8.4
Calcium: 8.6
Chloride: 103
Chloride: 104
Creatinine, Ser: 1.07
Creatinine, Ser: 1.18
GFR calc Af Amer: >60
GFR calc Af Amer: >60
GFR calc non Af Amer: >60
GFR calc non Af Amer: >60
Glucose, Bld: 111 — ABNORMAL HIGH
Glucose, Bld: 121 — ABNORMAL HIGH
Potassium: 3.9
Potassium: 4
Sodium: 139
Sodium: 142

## 2011-05-02 LAB — URINALYSIS, ROUTINE W REFLEX MICROSCOPIC
Glucose, UA: NEGATIVE
Ketones, ur: 15 — AB
Leukocytes, UA: NEGATIVE
Protein, ur: 100 — AB
Urobilinogen, UA: 1

## 2011-05-02 LAB — URINE MICROSCOPIC-ADD ON

## 2011-05-02 LAB — POCT I-STAT, CHEM 8
Chloride: 102
Creatinine, Ser: 1.5
Hemoglobin: 12.9 — ABNORMAL LOW
Potassium: 4
Sodium: 134 — ABNORMAL LOW

## 2011-05-02 LAB — CULTURE, BLOOD (ROUTINE X 2): Culture: NO GROWTH

## 2011-05-02 LAB — CK TOTAL AND CKMB (NOT AT ARMC): Total CK: 963 — ABNORMAL HIGH

## 2011-06-13 ENCOUNTER — Other Ambulatory Visit: Payer: Self-pay | Admitting: *Deleted

## 2011-06-13 MED ORDER — PRAVASTATIN SODIUM 20 MG PO TABS: 20.0000 mg | ORAL_TABLET | Freq: Every day | ORAL | Status: DC

## 2011-06-13 MED ORDER — RAMIPRIL 5 MG PO TABS: 5.0000 mg | ORAL_TABLET | Freq: Every day | ORAL | Status: DC

## 2011-06-13 MED ORDER — METOPROLOL TARTRATE 50 MG PO TABS: 50.0000 mg | ORAL_TABLET | Freq: Two times a day (BID) | ORAL | Status: DC

## 2011-12-30 ENCOUNTER — Encounter: Payer: Self-pay | Admitting: *Deleted

## 2013-12-12 ENCOUNTER — Emergency Department (HOSPITAL_COMMUNITY)
Admission: EM | Admit: 2013-12-12 | Discharge: 2013-12-12 | Disposition: A | Discharge status: Home / Self Care | Payer: Medicare Other | Attending: Emergency Medicine | Admitting: Emergency Medicine

## 2013-12-12 ENCOUNTER — Encounter (HOSPITAL_COMMUNITY): Payer: Self-pay | Admitting: Emergency Medicine

## 2013-12-12 DIAGNOSIS — Y9389 Activity, other specified: Secondary | ICD-10-CM | POA: Insufficient documentation

## 2013-12-12 DIAGNOSIS — Z951 Presence of aortocoronary bypass graft: Secondary | ICD-10-CM | POA: Insufficient documentation

## 2013-12-12 DIAGNOSIS — I252 Old myocardial infarction: Secondary | ICD-10-CM | POA: Insufficient documentation

## 2013-12-12 DIAGNOSIS — Y929 Unspecified place or not applicable: Secondary | ICD-10-CM | POA: Insufficient documentation

## 2013-12-12 DIAGNOSIS — G8929 Other chronic pain: Secondary | ICD-10-CM | POA: Insufficient documentation

## 2013-12-12 DIAGNOSIS — F0390 Unspecified dementia without behavioral disturbance: Secondary | ICD-10-CM | POA: Insufficient documentation

## 2013-12-12 DIAGNOSIS — W298XXA Contact with other powered powered hand tools and household machinery, initial encounter: Secondary | ICD-10-CM | POA: Insufficient documentation

## 2013-12-12 DIAGNOSIS — S61011A Laceration without foreign body of right thumb without damage to nail, initial encounter: Secondary | ICD-10-CM

## 2013-12-12 DIAGNOSIS — Z9861 Coronary angioplasty status: Secondary | ICD-10-CM | POA: Insufficient documentation

## 2013-12-12 DIAGNOSIS — E785 Hyperlipidemia, unspecified: Secondary | ICD-10-CM | POA: Insufficient documentation

## 2013-12-12 DIAGNOSIS — Z7902 Long term (current) use of antithrombotics/antiplatelets: Secondary | ICD-10-CM | POA: Insufficient documentation

## 2013-12-12 DIAGNOSIS — E1065 Type 1 diabetes mellitus with hyperglycemia: Secondary | ICD-10-CM

## 2013-12-12 DIAGNOSIS — F17200 Nicotine dependence, unspecified, uncomplicated: Secondary | ICD-10-CM | POA: Insufficient documentation

## 2013-12-12 DIAGNOSIS — Z23 Encounter for immunization: Secondary | ICD-10-CM | POA: Insufficient documentation

## 2013-12-12 DIAGNOSIS — S61209A Unspecified open wound of unspecified finger without damage to nail, initial encounter: Secondary | ICD-10-CM | POA: Insufficient documentation

## 2013-12-12 DIAGNOSIS — Z79899 Other long term (current) drug therapy: Secondary | ICD-10-CM | POA: Insufficient documentation

## 2013-12-12 DIAGNOSIS — I10 Essential (primary) hypertension: Secondary | ICD-10-CM | POA: Insufficient documentation

## 2013-12-12 DIAGNOSIS — IMO0002 Reserved for concepts with insufficient information to code with codable children: Secondary | ICD-10-CM | POA: Insufficient documentation

## 2013-12-12 MED ORDER — TETANUS-DIPHTH-ACELL PERTUSSIS 5-2.5-18.5 LF-MCG/0.5 IM SUSP
0.5000 mL | Freq: Once | INTRAMUSCULAR | Status: AC
  Administered 2013-12-12: 0.5 mL via INTRAMUSCULAR
  Filled 2013-12-12: qty 0.5
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
## 2013-12-12 NOTE — ED Notes (Signed)
Pt soaking thumb at present, bleeding controlled.
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
## 2013-12-12 NOTE — ED Notes (Signed)
Approx 2 cm laceration to L thumb from a box cutter at 9:30am Friday morning.  Bleeding controlled pta.  Unknown DT.

## 2013-12-12 NOTE — Discharge Instructions (Signed)
1. Medications: usual home medications 2. Treatment: rest, drink plenty of fluids, keep wound clean with warm soap and water 3. Follow Up: Please followup with your primary doctor for discussion of your diagnoses and further evaluation after today's visit; if you do not have a primary care doctor use the resource guide provided to find one;

## 2013-12-12 NOTE — ED Provider Notes (Signed)
Medical screening examination/treatment/procedure(s) were conducted as a shared visit with non-physician practitioner(s) and myself.  I personally evaluated the patient during the encounter.   Delora Fuel, MD 81/85/63 [id number]

## 2014-03-30 ENCOUNTER — Observation Stay (HOSPITAL_COMMUNITY)
Admission: EM | Admit: 2014-03-30 | Discharge: 2014-03-31 | Disposition: A | Discharge status: Home / Self Care | Payer: Medicare Other | Attending: Cardiology | Admitting: Cardiology

## 2014-03-30 ENCOUNTER — Encounter (HOSPITAL_COMMUNITY): Payer: Self-pay | Admitting: Emergency Medicine

## 2014-03-30 ENCOUNTER — Emergency Department (HOSPITAL_COMMUNITY): Payer: Medicare Other

## 2014-03-30 DIAGNOSIS — I10 Essential (primary) hypertension: Secondary | ICD-10-CM | POA: Diagnosis not present

## 2014-03-30 DIAGNOSIS — F0390 Unspecified dementia without behavioral disturbance: Secondary | ICD-10-CM | POA: Diagnosis not present

## 2014-03-30 DIAGNOSIS — I200 Unstable angina: Secondary | ICD-10-CM | POA: Insufficient documentation

## 2014-03-30 DIAGNOSIS — I2510 Atherosclerotic heart disease of native coronary artery without angina pectoris: Secondary | ICD-10-CM | POA: Diagnosis not present

## 2014-03-30 DIAGNOSIS — Z951 Presence of aortocoronary bypass graft: Secondary | ICD-10-CM | POA: Insufficient documentation

## 2014-03-30 DIAGNOSIS — Z7982 Long term (current) use of aspirin: Secondary | ICD-10-CM | POA: Diagnosis not present

## 2014-03-30 DIAGNOSIS — Z79899 Other long term (current) drug therapy: Secondary | ICD-10-CM | POA: Diagnosis not present

## 2014-03-30 DIAGNOSIS — E119 Type 2 diabetes mellitus without complications: Secondary | ICD-10-CM | POA: Diagnosis not present

## 2014-03-30 DIAGNOSIS — I25110 Atherosclerotic heart disease of native coronary artery with unstable angina pectoris: Secondary | ICD-10-CM

## 2014-03-30 DIAGNOSIS — F17200 Nicotine dependence, unspecified, uncomplicated: Secondary | ICD-10-CM | POA: Insufficient documentation

## 2014-03-30 DIAGNOSIS — R079 Chest pain, unspecified: Secondary | ICD-10-CM | POA: Diagnosis not present

## 2014-03-30 DIAGNOSIS — I252 Old myocardial infarction: Secondary | ICD-10-CM

## 2014-03-30 DIAGNOSIS — E785 Hyperlipidemia, unspecified: Secondary | ICD-10-CM | POA: Diagnosis not present

## 2014-03-30 HISTORY — DX: Adverse effect of unspecified anesthetic, initial encounter: T41.45XA

## 2014-03-30 HISTORY — DX: Atherosclerotic heart disease of native coronary artery without angina pectoris: I25.10

## 2014-03-30 HISTORY — DX: Other complications of anesthesia, initial encounter: T88.59XA

## 2014-03-30 HISTORY — DX: Shortness of breath: R06.02

## 2014-03-30 HISTORY — DX: Nausea with vomiting, unspecified: R11.2

## 2014-03-30 HISTORY — DX: Other specified postprocedural states: Z98.890

## 2014-03-30 LAB — RAPID URINE DRUG SCREEN, HOSP PERFORMED
Amphetamines: NOT DETECTED
BARBITURATES: NOT DETECTED
BENZODIAZEPINES: NOT DETECTED
Cocaine: NOT DETECTED
Opiates: NOT DETECTED
Tetrahydrocannabinol: POSITIVE — AB

## 2014-03-30 LAB — PROTIME-INR
INR: 1.05 (ref 0.00–1.49)
PROTHROMBIN TIME: 13.7 s (ref 11.6–15.2)

## 2014-03-30 LAB — HEPATIC FUNCTION PANEL
ALT: 44 U/L (ref 0–53)
AST: 35 U/L (ref 0–37)
Albumin: 3.6 g/dL (ref 3.5–5.2)
Alkaline Phosphatase: 62 U/L (ref 39–117)
Bilirubin, Direct: <0.2 mg/dL (ref 0.0–0.3)
TOTAL PROTEIN: 7.3 g/dL (ref 6.0–8.3)
Total Bilirubin: 0.6 mg/dL (ref 0.3–1.2)

## 2014-03-30 LAB — PRO B NATRIURETIC PEPTIDE: PRO B NATRI PEPTIDE: 180.8 pg/mL — AB (ref 0–125)

## 2014-03-30 LAB — CBC
HCT: 43.1 % (ref 39.0–52.0)
HCT: 42.2 % (ref 39.0–52.0)
HEMOGLOBIN: 14.7 g/dL (ref 13.0–17.0)
Hemoglobin: 15 g/dL (ref 13.0–17.0)
MCH: 31.9 pg (ref 26.0–34.0)
MCH: 32 pg (ref 26.0–34.0)
MCHC: 34.8 g/dL (ref 30.0–36.0)
MCHC: 34.8 g/dL (ref 30.0–36.0)
MCV: 91.7 fL (ref 78.0–100.0)
MCV: 91.7 fL (ref 78.0–100.0)
Platelets: 161 10*3/uL (ref 150–400)
Platelets: 154 10*3/uL (ref 150–400)
RBC: 4.7 MIL/uL (ref 4.22–5.81)
RBC: 4.6 MIL/uL (ref 4.22–5.81)
RDW: 14.1 % (ref 11.5–15.5)
RDW: 14.2 % (ref 11.5–15.5)
WBC: 3.7 10*3/uL — AB (ref 4.0–10.5)
WBC: 3.9 10*3/uL — ABNORMAL LOW (ref 4.0–10.5)

## 2014-03-30 LAB — CREATININE, SERUM
CREATININE: 0.98 mg/dL (ref 0.50–1.35)
GFR calc Af Amer: >90 mL/min (ref >90)
GFR, EST NON AFRICAN AMERICAN: 84 mL/min — AB (ref >90)

## 2014-03-30 LAB — I-STAT TROPONIN, ED: Troponin i, poc: 0 ng/mL (ref 0.00–0.08)

## 2014-03-30 LAB — BASIC METABOLIC PANEL
ANION GAP: 9 (ref 5–15)
BUN: 11 mg/dL (ref 6–23)
CALCIUM: 9.3 mg/dL (ref 8.4–10.5)
CO2: 27 mEq/L (ref 19–32)
Chloride: 105 mEq/L (ref 96–112)
Creatinine, Ser: 0.99 mg/dL (ref 0.50–1.35)
GFR, EST NON AFRICAN AMERICAN: 84 mL/min — AB (ref >90)
Glucose, Bld: 90 mg/dL (ref 70–99)
Potassium: 3.9 mEq/L (ref 3.7–5.3)
SODIUM: 141 meq/L (ref 137–147)

## 2014-03-30 LAB — TROPONIN I: Troponin I: <0.3 ng/mL (ref <0.30)

## 2014-03-30 LAB — ETHANOL: Alcohol, Ethyl (B): <11 mg/dL (ref 0–11)

## 2014-03-30 LAB — GLUCOSE, CAPILLARY: Glucose-Capillary: 94 mg/dL (ref 70–99)

## 2014-03-30 MED ORDER — METOPROLOL TARTRATE 50 MG PO TABS
50.0000 mg | ORAL_TABLET | Freq: Every day | ORAL | Status: DC
  Administered 2014-03-30: 50 mg via ORAL
  Filled 2014-03-30 (×2): qty 1

## 2014-03-30 MED ORDER — ASPIRIN EC 81 MG PO TBEC
81.0000 mg | DELAYED_RELEASE_TABLET | Freq: Every day | ORAL | Status: DC
  Filled 2014-03-30: qty 1

## 2014-03-30 MED ORDER — PNEUMOCOCCAL VAC POLYVALENT 25 MCG/0.5ML IJ INJ
0.5000 mL | INJECTION | INTRAMUSCULAR | Status: DC
  Filled 2014-03-30: qty 0.5

## 2014-03-30 MED ORDER — SODIUM CHLORIDE 0.9 % IV SOLN
1000.0000 mL | INTRAVENOUS | Status: DC
  Administered 2014-03-30: 1000 mL via INTRAVENOUS

## 2014-03-30 MED ORDER — ONDANSETRON HCL 4 MG/2ML IJ SOLN: 4.0000 mg | Freq: Four times a day (QID) | INTRAMUSCULAR | Status: DC | PRN

## 2014-03-30 MED ORDER — HYDRALAZINE HCL 20 MG/ML IJ SOLN
10.0000 mg | Freq: Once | INTRAMUSCULAR | Status: AC
  Administered 2014-03-30: 10 mg via INTRAVENOUS
  Filled 2014-03-30: qty 1

## 2014-03-30 MED ORDER — ASPIRIN 300 MG RE SUPP: 300.0000 mg | RECTAL | Status: AC

## 2014-03-30 MED ORDER — HYDROCHLOROTHIAZIDE 25 MG PO TABS
25.0000 mg | ORAL_TABLET | Freq: Every day | ORAL | Status: DC
  Administered 2014-03-30: 25 mg via ORAL
  Filled 2014-03-30 (×2): qty 1

## 2014-03-30 MED ORDER — ASPIRIN EC 81 MG PO TBEC: 324.0000 mg | DELAYED_RELEASE_TABLET | Freq: Once | ORAL | Status: DC

## 2014-03-30 MED ORDER — HEPARIN SODIUM (PORCINE) 5000 UNIT/ML IJ SOLN
5000.0000 [IU] | Freq: Three times a day (TID) | INTRAMUSCULAR | Status: DC
  Administered 2014-03-30 (×2): 5000 [IU] via SUBCUTANEOUS
  Filled 2014-03-30 (×5): qty 1

## 2014-03-30 MED ORDER — ATORVASTATIN CALCIUM 40 MG PO TABS
40.0000 mg | ORAL_TABLET | Freq: Every day | ORAL | Status: DC
  Administered 2014-03-30: 40 mg via ORAL
  Filled 2014-03-30 (×2): qty 1

## 2014-03-30 MED ORDER — ACETAMINOPHEN 325 MG PO TABS
650.0000 mg | ORAL_TABLET | ORAL | Status: DC | PRN
Start: 2014-03-30 — End: 2014-03-31

## 2014-03-30 MED ORDER — AMLODIPINE BESYLATE 5 MG PO TABS
5.0000 mg | ORAL_TABLET | Freq: Every day | ORAL | Status: DC
  Administered 2014-03-30: 5 mg via ORAL
  Filled 2014-03-30 (×2): qty 1

## 2014-03-30 MED ORDER — NITROGLYCERIN 0.4 MG SL SUBL: 0.4000 mg | SUBLINGUAL_TABLET | SUBLINGUAL | Status: DC | PRN

## 2014-03-30 MED ORDER — ASPIRIN 81 MG PO CHEW
324.0000 mg | CHEWABLE_TABLET | ORAL | Status: AC
  Administered 2014-03-30: 324 mg via ORAL
  Filled 2014-03-30: qty 4

## 2014-03-30 MED ORDER — RAMIPRIL 10 MG PO CAPS
10.0000 mg | ORAL_CAPSULE | Freq: Every day | ORAL | Status: DC
  Administered 2014-03-30: 10 mg via ORAL
  Filled 2014-03-30 (×2): qty 1

## 2014-03-30 NOTE — ED Notes (Signed)
To ED via [facility] with chest pain-- an episode that started this am while walking. Walks every morning. States pain was left sided, 8/10 on pain scale, received ASA 324mg  per EMS-- pain free on arrival.

## 2014-03-30 NOTE — ED Provider Notes (Signed)
The patient presented to the emergency room with an episode of chest pain that started this morning while he was walking. Patient states the pain was sharp on the left side and severe. It lasted for just a few minutes. He denied any shortness of breath or diaphoresis. He called 911 and was transported to the ED. He denies any pain right now.  Patient has a history of coronary artery disease. He has not seen his cardiologist in a while. When I asked him about a primary care doctor he indicated that a nurse checks on him regularly Physical Exam  BP 172/108  Pulse 65  Temp(Src) 98.6 F (37 C) (Oral)  Resp 24  SpO2 100%  Physical Exam  Nursing note and vitals reviewed. Constitutional: He appears well-developed and well-nourished. No distress.  HENT:  Head: Normocephalic and atraumatic.  Right Ear: External ear normal.  Left Ear: External ear normal.  Eyes: Conjunctivae are normal. Right eye exhibits no discharge. Left eye exhibits no discharge. No scleral icterus.  Neck: Neck supple. No tracheal deviation present.  Cardiovascular: Normal rate.   Pulmonary/Chest: Effort normal. No stridor. No respiratory distress.  Musculoskeletal: He exhibits no edema.  Neurological: He is alert. Cranial nerve deficit: no gross deficits.  Skin: Skin is warm and dry. No rash noted.  Psychiatric: He has a normal mood and affect.    ED Course  Procedures  EKG Interpretation  Date/Time:  Tuesday March 30 2014 08:09:59 EDT Ventricular Rate:  59 PR Interval:  215 QRS Duration: 100 QT Interval:  413 QTC Calculation: 409 R Axis:   33 Text Interpretation:  Sinus rhythm Prolonged PR interval Prominent P waves, nondiagnostic Probable left ventricular hypertrophy No significant change since last tracing Confirmed by Bhavik Cabiness  MD-J, Aashvi Rezabek ([id number]) on 03/30/2014 8:17:44 AM       MDM Patient's symptoms are typical for cardiac chest pain however he does have a known history of coronary artery disease. I'm not  certain if he has been very compliant with his medical care. Plan on laboratory tests including cardiac enzymes.  Consult with cardiology.  I saw and evaluated the patient, reviewed the resident's note and I agree with the findings and plan.     Dorie Rank, MD 03/30/14 [phone]
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
## 2014-03-30 NOTE — ED Notes (Signed)
Patient unable to void at this time

## 2014-03-31 DIAGNOSIS — E785 Hyperlipidemia, unspecified: Secondary | ICD-10-CM | POA: Diagnosis not present

## 2014-03-31 DIAGNOSIS — I10 Essential (primary) hypertension: Secondary | ICD-10-CM | POA: Diagnosis not present

## 2014-03-31 DIAGNOSIS — I252 Old myocardial infarction: Secondary | ICD-10-CM | POA: Diagnosis not present

## 2014-03-31 DIAGNOSIS — R079 Chest pain, unspecified: Secondary | ICD-10-CM | POA: Diagnosis not present

## 2014-03-31 LAB — LIPID PANEL
CHOLESTEROL: 133 mg/dL (ref 0–200)
HDL: 48 mg/dL (ref >39)
LDL Cholesterol: 71 mg/dL (ref 0–99)
Total CHOL/HDL Ratio: 2.8 RATIO
Triglycerides: 68 mg/dL (ref <150)
VLDL: 14 mg/dL (ref 0–40)

## 2014-03-31 LAB — CBC
HCT: 43.6 % (ref 39.0–52.0)
Hemoglobin: 15.6 g/dL (ref 13.0–17.0)
MCH: 31.9 pg (ref 26.0–34.0)
MCHC: 35.8 g/dL (ref 30.0–36.0)
MCV: 89.2 fL (ref 78.0–100.0)
Platelets: 164 10*3/uL (ref 150–400)
RBC: 4.89 MIL/uL (ref 4.22–5.81)
RDW: 13.8 % (ref 11.5–15.5)
WBC: 4.7 10*3/uL (ref 4.0–10.5)

## 2014-03-31 LAB — BASIC METABOLIC PANEL
ANION GAP: 11 (ref 5–15)
BUN: 13 mg/dL (ref 6–23)
CALCIUM: 9.8 mg/dL (ref 8.4–10.5)
CO2: 26 mEq/L (ref 19–32)
CREATININE: 0.94 mg/dL (ref 0.50–1.35)
Chloride: 104 mEq/L (ref 96–112)
GFR calc non Af Amer: 86 mL/min — ABNORMAL LOW (ref >90)
Glucose, Bld: 112 mg/dL — ABNORMAL HIGH (ref 70–99)
Potassium: 4.2 mEq/L (ref 3.7–5.3)
Sodium: 141 mEq/L (ref 137–147)

## 2014-03-31 LAB — TROPONIN I: Troponin I: <0.3 ng/mL (ref <0.30)

## 2014-03-31 NOTE — Progress Notes (Signed)
Pt left against medical advice. Pt stated he did not want to get a stress test done that he had somewhere to be this morning. RN informed the pt of why he was getting the test done & that he should stay. Pt agreed to stay at first but was later noticed walking he hallways with all of his clothes & shoes on. Cardiologist on call notified that the pt requested to leave & that the RN was trying to convince the pt to stay. Pt given AMA papers. Pt's IV as well as cardiac monitor were removed. Hoover Brunette, RN

## 2014-04-07 NOTE — Discharge Summary (Signed)
    Patient left AGAINST MEDICAL ADVICE. Please see RN note for details. Also please see my history and physical for clinical information.  Candee Furbish, MD

## 2014-04-16 ENCOUNTER — Emergency Department (HOSPITAL_COMMUNITY)
Admission: EM | Admit: 2014-04-16 | Discharge: 2014-04-16 | Disposition: A | Discharge status: Home / Self Care | Payer: Medicare Other

## 2014-04-16 NOTE — ED Notes (Signed)
Paged pt twice no answer

## 2014-04-16 NOTE — ED Notes (Signed)
Paged the patient for triage and no answer.

## 2014-04-16 NOTE — ED Notes (Signed)
Called for pt with no answer 

## 2014-05-07 ENCOUNTER — Emergency Department (HOSPITAL_COMMUNITY)
Admission: EM | Admit: 2014-05-07 | Discharge: 2014-05-07 | Disposition: A | Discharge status: Home / Self Care | Payer: Medicare Other | Attending: Emergency Medicine | Admitting: Emergency Medicine

## 2014-05-07 ENCOUNTER — Encounter (HOSPITAL_COMMUNITY): Payer: Self-pay | Admitting: Emergency Medicine

## 2014-05-07 ENCOUNTER — Emergency Department (HOSPITAL_COMMUNITY): Payer: Medicare Other

## 2014-05-07 DIAGNOSIS — I252 Old myocardial infarction: Secondary | ICD-10-CM | POA: Insufficient documentation

## 2014-05-07 DIAGNOSIS — I2510 Atherosclerotic heart disease of native coronary artery without angina pectoris: Secondary | ICD-10-CM | POA: Diagnosis not present

## 2014-05-07 DIAGNOSIS — F1210 Cannabis abuse, uncomplicated: Secondary | ICD-10-CM | POA: Insufficient documentation

## 2014-05-07 DIAGNOSIS — E119 Type 2 diabetes mellitus without complications: Secondary | ICD-10-CM | POA: Insufficient documentation

## 2014-05-07 DIAGNOSIS — I10 Essential (primary) hypertension: Secondary | ICD-10-CM | POA: Diagnosis not present

## 2014-05-07 DIAGNOSIS — R456 Violent behavior: Secondary | ICD-10-CM | POA: Diagnosis present

## 2014-05-07 DIAGNOSIS — F0391 Unspecified dementia with behavioral disturbance: Secondary | ICD-10-CM

## 2014-05-07 DIAGNOSIS — Y92510 Bank as the place of occurrence of the external cause: Secondary | ICD-10-CM | POA: Insufficient documentation

## 2014-05-07 DIAGNOSIS — F10920 Alcohol use, unspecified with intoxication, uncomplicated: Secondary | ICD-10-CM

## 2014-05-07 DIAGNOSIS — Z8639 Personal history of other endocrine, nutritional and metabolic disease: Secondary | ICD-10-CM | POA: Insufficient documentation

## 2014-05-07 DIAGNOSIS — S61012A Laceration without foreign body of left thumb without damage to nail, initial encounter: Secondary | ICD-10-CM | POA: Diagnosis not present

## 2014-05-07 DIAGNOSIS — Z9861 Coronary angioplasty status: Secondary | ICD-10-CM | POA: Insufficient documentation

## 2014-05-07 DIAGNOSIS — S0101XA Laceration without foreign body of scalp, initial encounter: Secondary | ICD-10-CM | POA: Insufficient documentation

## 2014-05-07 DIAGNOSIS — W25XXXA Contact with sharp glass, initial encounter: Secondary | ICD-10-CM | POA: Diagnosis not present

## 2014-05-07 DIAGNOSIS — Z951 Presence of aortocoronary bypass graft: Secondary | ICD-10-CM | POA: Diagnosis not present

## 2014-05-07 DIAGNOSIS — Y9389 Activity, other specified: Secondary | ICD-10-CM | POA: Insufficient documentation

## 2014-05-07 DIAGNOSIS — Z79899 Other long term (current) drug therapy: Secondary | ICD-10-CM | POA: Insufficient documentation

## 2014-05-07 DIAGNOSIS — Z720 Tobacco use: Secondary | ICD-10-CM | POA: Diagnosis not present

## 2014-05-07 LAB — BASIC METABOLIC PANEL
ANION GAP: 15 (ref 5–15)
BUN: 8 mg/dL (ref 6–23)
CALCIUM: 9.7 mg/dL (ref 8.4–10.5)
CO2: 25 meq/L (ref 19–32)
Chloride: 102 mEq/L (ref 96–112)
Creatinine, Ser: 0.98 mg/dL (ref 0.50–1.35)
GFR calc Af Amer: >90 mL/min (ref >90)
GFR calc non Af Amer: 84 mL/min — ABNORMAL LOW (ref >90)
Glucose, Bld: 97 mg/dL (ref 70–99)
Potassium: 3.6 mEq/L — ABNORMAL LOW (ref 3.7–5.3)
Sodium: 142 mEq/L (ref 137–147)

## 2014-05-07 LAB — URINALYSIS, ROUTINE W REFLEX MICROSCOPIC
BILIRUBIN URINE: NEGATIVE
Glucose, UA: NEGATIVE mg/dL
Hgb urine dipstick: NEGATIVE
KETONES UR: NEGATIVE mg/dL
NITRITE: NEGATIVE
Protein, ur: NEGATIVE mg/dL
Specific Gravity, Urine: 1.003 — ABNORMAL LOW (ref 1.005–1.030)
Urobilinogen, UA: 0.2 mg/dL (ref 0.0–1.0)
pH: 5 (ref 5.0–8.0)

## 2014-05-07 LAB — CBC WITH DIFFERENTIAL/PLATELET
BASOS ABS: 0 10*3/uL (ref 0.0–0.1)
BASOS PCT: 1 % (ref 0–1)
EOS PCT: 4 % (ref 0–5)
Eosinophils Absolute: 0.2 10*3/uL (ref 0.0–0.7)
HEMATOCRIT: 42.8 % (ref 39.0–52.0)
HEMOGLOBIN: 15 g/dL (ref 13.0–17.0)
Lymphocytes Relative: 56 % — ABNORMAL HIGH (ref 12–46)
Lymphs Abs: 2.5 10*3/uL (ref 0.7–4.0)
MCH: 31.6 pg (ref 26.0–34.0)
MCHC: 35 g/dL (ref 30.0–36.0)
MCV: 90.1 fL (ref 78.0–100.0)
MONO ABS: 0.3 10*3/uL (ref 0.1–1.0)
MONOS PCT: 6 % (ref 3–12)
Neutro Abs: 1.5 10*3/uL — ABNORMAL LOW (ref 1.7–7.7)
Neutrophils Relative %: 33 % — ABNORMAL LOW (ref 43–77)
Platelets: 177 10*3/uL (ref 150–400)
RBC: 4.75 MIL/uL (ref 4.22–5.81)
RDW: 13.6 % (ref 11.5–15.5)
WBC: 4.4 10*3/uL (ref 4.0–10.5)

## 2014-05-07 LAB — URINE MICROSCOPIC-ADD ON

## 2014-05-07 LAB — RAPID URINE DRUG SCREEN, HOSP PERFORMED
AMPHETAMINES: NOT DETECTED
BARBITURATES: NOT DETECTED
Benzodiazepines: NOT DETECTED
COCAINE: NOT DETECTED
Opiates: NOT DETECTED
Tetrahydrocannabinol: POSITIVE — AB

## 2014-05-07 LAB — ETHANOL: ALCOHOL ETHYL (B): 169 mg/dL — AB (ref 0–11)
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
## 2014-05-07 NOTE — Discharge Instructions (Signed)
Alcohol Intoxication  Alcohol intoxication occurs when the amount of alcohol that a person has consumed impairs his or her ability to mentally and physically function. Alcohol directly impairs the normal chemical activity of the brain. Drinking large amounts of alcohol can lead to changes in mental function and behavior, and it can cause many physical effects that can be harmful.   Alcohol intoxication can range in severity from mild to very severe. Various factors can affect the level of intoxication that occurs, such as the person's age, gender, weight, frequency of alcohol consumption, and the presence of other medical conditions (such as diabetes, seizures, or heart conditions). Dangerous levels of alcohol intoxication may occur when people drink large amounts of alcohol in a short period (binge drinking). Alcohol can also be especially dangerous when combined with certain prescription medicines or "recreational" drugs.  SIGNS AND SYMPTOMS  Some common signs and symptoms of mild alcohol intoxication include:  · Loss of coordination.  · Changes in mood and behavior.  · Impaired judgment.  · Slurred speech.  As alcohol intoxication progresses to more severe levels, other signs and symptoms will appear. These may include:  · Vomiting.  · Confusion and impaired memory.  · Slowed breathing.  · Seizures.  · Loss of consciousness.  DIAGNOSIS   Your health care provider will take a medical history and perform a physical exam. You will be asked about the amount and type of alcohol you have consumed. Blood tests will be done to measure the concentration of alcohol in your blood. In many places, your blood alcohol level must be lower than 80 mg/dL (0.08%) to legally drive. However, many dangerous effects of alcohol can occur at much lower levels.   TREATMENT   People with alcohol intoxication often do not require treatment. Most of the effects of alcohol intoxication are temporary, and they go away as the alcohol naturally  leaves the body. Your health care provider will monitor your condition until you are stable enough to go home. Fluids are sometimes given through an IV access tube to help prevent dehydration.   HOME CARE INSTRUCTIONS  · Do not drive after drinking alcohol.  · Stay hydrated. Drink enough water and fluids to keep your urine clear or pale yellow. Avoid caffeine.    · Only take over-the-counter or prescription medicines as directed by your health care provider.    SEEK MEDICAL CARE IF:   · You have persistent vomiting.    · You do not feel better after a few days.  · You have frequent alcohol intoxication. Your health care provider can help determine if you should see a substance use treatment counselor.  SEEK IMMEDIATE MEDICAL CARE IF:   · You become shaky or tremble when you try to stop drinking.    · You shake uncontrollably (seizure).    · You throw up (vomit) blood. This may be bright red or may look like black coffee grounds.    · You have blood in your stool. This may be bright red or may appear as a black, tarry, bad smelling stool.    · You become lightheaded or faint.    MAKE SURE YOU:   · Understand these instructions.  · Will watch your condition.  · Will get help right away if you are not doing well or get worse.  Document Released: 05/02/2005 Document Revised: 03/25/2013 Document Reviewed: 12/26/2012  Exit[employer]® Patient Information ©2015 [employer]. This information is not intended to replace advice given to you by your health care provider. Make sure   you discuss any questions you have with your health care provider.

## 2014-05-07 NOTE — ED Notes (Signed)
Per police, pt is from an assisted living facility, [facility]. Contact info [phone]
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
## 2014-05-07 NOTE — ED Notes (Signed)
Pt was at the nursing station yelling and requesting his clothes so that he can go home. MD Tomi Bamberger made aware. Off-duty police and security at bedside at this time. Police will be taking pt to 24 hour holding because he refuses to stay here and facility will not take pt back at this time because he is intoxicated.
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
## 2014-05-07 NOTE — ED Notes (Signed)
Patient transported to CT 

## 2014-07-07 ENCOUNTER — Ambulatory Visit: Payer: Medicare Other | Admitting: Cardiology

## 2014-08-06 ENCOUNTER — Emergency Department (HOSPITAL_COMMUNITY)
Admission: EM | Admit: 2014-08-06 | Discharge: 2014-08-06 | Disposition: A | Discharge status: Home / Self Care | Payer: Medicare Other | Attending: Emergency Medicine | Admitting: Emergency Medicine

## 2014-08-06 ENCOUNTER — Encounter (HOSPITAL_COMMUNITY): Payer: Self-pay | Admitting: Emergency Medicine

## 2014-08-06 ENCOUNTER — Emergency Department (HOSPITAL_COMMUNITY): Payer: Medicare Other

## 2014-08-06 DIAGNOSIS — Z87828 Personal history of other (healed) physical injury and trauma: Secondary | ICD-10-CM | POA: Insufficient documentation

## 2014-08-06 DIAGNOSIS — Z720 Tobacco use: Secondary | ICD-10-CM | POA: Insufficient documentation

## 2014-08-06 DIAGNOSIS — E119 Type 2 diabetes mellitus without complications: Secondary | ICD-10-CM | POA: Diagnosis not present

## 2014-08-06 DIAGNOSIS — I159 Secondary hypertension, unspecified: Secondary | ICD-10-CM | POA: Diagnosis not present

## 2014-08-06 DIAGNOSIS — R079 Chest pain, unspecified: Secondary | ICD-10-CM | POA: Insufficient documentation

## 2014-08-06 DIAGNOSIS — F0390 Unspecified dementia without behavioral disturbance: Secondary | ICD-10-CM | POA: Diagnosis not present

## 2014-08-06 DIAGNOSIS — Z951 Presence of aortocoronary bypass graft: Secondary | ICD-10-CM | POA: Insufficient documentation

## 2014-08-06 DIAGNOSIS — I2510 Atherosclerotic heart disease of native coronary artery without angina pectoris: Secondary | ICD-10-CM | POA: Insufficient documentation

## 2014-08-06 DIAGNOSIS — I252 Old myocardial infarction: Secondary | ICD-10-CM | POA: Diagnosis not present

## 2014-08-06 LAB — I-STAT CHEM 8, ED
BUN: 12 mg/dL (ref 6–23)
CALCIUM ION: 1.1 mmol/L — AB (ref 1.13–1.30)
CREATININE: 0.9 mg/dL (ref 0.50–1.35)
Chloride: 105 mEq/L (ref 96–112)
GLUCOSE: 84 mg/dL (ref 70–99)
HCT: 40 % (ref 39.0–52.0)
HEMOGLOBIN: 13.6 g/dL (ref 13.0–17.0)
Potassium: 3.9 mmol/L (ref 3.5–5.1)
Sodium: 141 mmol/L (ref 135–145)
TCO2: 23 mmol/L (ref 0–100)

## 2014-08-06 LAB — I-STAT TROPONIN, ED: Troponin i, poc: 0.01 ng/mL (ref 0.00–0.08)

## 2014-08-06 LAB — TROPONIN I: Troponin I: <0.03 ng/mL (ref <0.031)

## 2014-08-06 MED ORDER — CLONIDINE HCL 0.1 MG PO TABS
0.1000 mg | ORAL_TABLET | Freq: Every day | ORAL | Status: DC
Start: 2014-08-06 — End: 2014-08-06

## 2014-08-06 MED ORDER — RAMIPRIL 5 MG PO CAPS: 10.0000 mg | ORAL_CAPSULE | Freq: Every day | ORAL | Status: DC

## 2014-08-06 MED ORDER — CLONIDINE HCL 0.1 MG PO TABS
0.2000 mg | ORAL_TABLET | Freq: Once | ORAL | Status: AC
  Administered 2014-08-06: 0.2 mg via ORAL
  Filled 2014-08-06: qty 2

## 2014-08-06 MED ORDER — CLONIDINE HCL 0.1 MG PO TABS: 0.1000 mg | ORAL_TABLET | Freq: Two times a day (BID) | ORAL | Status: DC

## 2014-08-06 MED ORDER — CLONIDINE HCL 0.1 MG PO TABS
0.2000 mg | ORAL_TABLET | Freq: Once | ORAL | Status: AC
Start: 2014-08-06 — End: 2014-08-06
  Administered 2014-08-06: 0.2 mg via ORAL
  Filled 2014-08-06: qty 2

## 2014-08-06 MED ORDER — RAMIPRIL 10 MG PO CAPS
10.0000 mg | ORAL_CAPSULE | Freq: Every day | ORAL | Status: DC
  Administered 2014-08-06: 10 mg via ORAL
  Filled 2014-08-06: qty 1

## 2014-08-06 MED ORDER — CLONIDINE HCL 0.1 MG PO TABS
0.1000 mg | ORAL_TABLET | ORAL | Status: DC | PRN
  Administered 2014-08-06 (×2): 0.1 mg via ORAL
  Filled 2014-08-06 (×2): qty 1

## 2014-08-06 NOTE — ED Provider Notes (Signed)
The patient was turned over from Dr. Audie Pinto for a completion of care. I have reviewed the patient's history present illness. He reports a 3 second episode of lancinating chest pain. He did not have any associated syncope or diaphoresis. He reports this occurred at rest. He reports he was just standing around. At this point diagnostic workup does not indicate likely cardiopulmonary etiology. The patient's had 2 sets of troponins are both normal. Dr. Audie Pinto had given the patient clonidine for hypertension. The patient reports he has been off of his antihypertensive medications for over 8 months. He is not currently endorsing symptoms of end organ damage. Dr. Audie Pinto has written for the patient to continue clonidine. Reviewing the patient's medical record I also note that he had previously been on ramipril. At this time I will continue the patient on his ramipril and have him follow up with a primary care provider.  Charlesetta Shanks, MD 08/06/14 [phone]
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
## 2014-08-06 NOTE — Discharge Instructions (Signed)
Chest Pain (Nonspecific) °It is often hard to give a specific diagnosis for the cause of chest pain. There is always a chance that your pain could be related to something serious, such as a heart attack or a blood clot in the lungs. You need to follow up with your health care provider for further evaluation. °CAUSES  °· Heartburn. °· Pneumonia or bronchitis. °· Anxiety or stress. °· Inflammation around your heart (pericarditis) or lung (pleuritis or pleurisy). °· A blood clot in the lung. °· A collapsed lung (pneumothorax). It can develop suddenly on its own (spontaneous pneumothorax) or from trauma to the chest. °· Shingles infection (herpes zoster virus). °The chest wall is composed of bones, muscles, and cartilage. Any of these can be the source of the pain. °· The bones can be bruised by injury. °· The muscles or cartilage can be strained by coughing or overwork. °· The cartilage can be affected by inflammation and become sore (costochondritis). °DIAGNOSIS  °Lab tests or other studies may be needed to find the cause of your pain. Your health care provider may have you take a test called an ambulatory electrocardiogram (ECG). An ECG records your heartbeat patterns over a 24-hour period. You may also have other tests, such as: °· Transthoracic echocardiogram (TTE). During echocardiography, sound waves are used to evaluate how blood flows through your heart. °· Transesophageal echocardiogram (TEE). °· Cardiac monitoring. This allows your health care provider to monitor your heart rate and rhythm in real time. °· Holter monitor. This is a portable device that records your heartbeat and can help diagnose heart arrhythmias. It allows your health care provider to track your heart activity for several days, if needed. °· Stress tests by exercise or by giving medicine that makes the heart beat faster. °TREATMENT  °· Treatment depends on what may be causing your chest pain. Treatment may include: °· Acid blockers for  heartburn. °· Anti-inflammatory medicine. °· Pain medicine for inflammatory conditions. °· Antibiotics if an infection is present. °· You may be advised to change lifestyle habits. This includes stopping smoking and avoiding alcohol, caffeine, and chocolate. °· You may be advised to keep your head raised (elevated) when sleeping. This reduces the chance of acid going backward from your stomach into your esophagus. °Most of the time, nonspecific chest pain will improve within 2-3 days with rest and mild pain medicine.  °HOME CARE INSTRUCTIONS  °· If antibiotics were prescribed, take them as directed. Finish them even if you start to feel better. °· For the next few days, avoid physical activities that bring on chest pain. Continue physical activities as directed. °· Do not use any tobacco products, including cigarettes, chewing tobacco, or electronic cigarettes. °· Avoid drinking alcohol. °· Only take medicine as directed by your health care provider. °· Follow your health care provider's suggestions for further testing if your chest pain does not go away. °· Keep any follow-up appointments you made. If you do not go to an appointment, you could develop lasting (chronic) problems with pain. If there is any problem keeping an appointment, call to reschedule. °SEEK MEDICAL CARE IF:  °· Your chest pain does not go away, even after treatment. °· You have a rash with blisters on your chest. °· You have a fever. °SEEK IMMEDIATE MEDICAL CARE IF:  °· You have increased chest pain or pain that spreads to your arm, neck, jaw, back, or abdomen. °· You have shortness of breath. °· You have an increasing cough, or you cough   up blood.  You have severe back or abdominal pain.  You feel nauseous or vomit.  You have severe weakness.  You faint.  You have chills. This is an emergency. Do not wait to see if the pain will go away. Get medical help at once. Call your local emergency services (911 in [place] Do not drive  yourself to the hospital. MAKE SURE YOU:   Understand these instructions.  Will watch your condition.  Will get help right away if you are not doing well or get worse. Document Released: 05/02/2005 Document Revised: 07/28/2013 Document Reviewed: 02/26/2008 [employer] Patient Information 2015 [place], [place]. This information is not intended to replace advice given to you by your health care provider. Make sure you discuss any questions you have with your health care provider.  Hypertension Hypertension, commonly called high blood pressure, is when the force of blood pumping through your arteries is too strong. Your arteries are the blood vessels that carry blood from your heart throughout your body. A blood pressure reading consists of a higher number over a lower number, such as 110/72. The higher number (systolic) is the pressure inside your arteries when your heart pumps. The lower number (diastolic) is the pressure inside your arteries when your heart relaxes. Ideally you want your blood pressure below 120/80. Hypertension forces your heart to work harder to pump blood. Your arteries may become narrow or stiff. Having hypertension puts you at risk for heart disease, stroke, and other problems.  RISK FACTORS Some risk factors for high blood pressure are controllable. Others are not.  Risk factors you cannot control include:   Race. You may be at higher risk if you are African American.  Age. Risk increases with age.  Gender. Men are at higher risk than women before age 17 years. After age 24, women are at higher risk than men. Risk factors you can control include:  Not getting enough exercise or physical activity.  Being overweight.  Getting too much fat, sugar, calories, or salt in your diet.  Drinking too much alcohol. SIGNS AND SYMPTOMS Hypertension does not usually cause signs or symptoms. Extremely high blood pressure (hypertensive crisis) may cause headache, anxiety, shortness  of breath, and nosebleed. DIAGNOSIS  To check if you have hypertension, your health care provider will measure your blood pressure while you are seated, with your arm held at the level of your heart. It should be measured at least twice using the same arm. Certain conditions can cause a difference in blood pressure between your right and left arms. A blood pressure reading that is higher than normal on one occasion does not mean that you need treatment. If one blood pressure reading is high, ask your health care provider about having it checked again. TREATMENT  Treating high blood pressure includes making lifestyle changes and possibly taking medicine. Living a healthy lifestyle can help lower high blood pressure. You may need to change some of your habits. Lifestyle changes may include:  Following the DASH diet. This diet is high in fruits, vegetables, and whole grains. It is low in salt, red meat, and added sugars.  Getting at least 2 hours of brisk physical activity every week.  Losing weight if necessary.  Not smoking.  Limiting alcoholic beverages.  Learning ways to reduce stress. If lifestyle changes are not enough to get your blood pressure under control, your health care provider may prescribe medicine. You may need to take more than one. Work closely with your health care provider  to understand the risks and benefits. HOME CARE INSTRUCTIONS  Have your blood pressure rechecked as directed by your health care provider.   Take medicines only as directed by your health care provider. Follow the directions carefully. Blood pressure medicines must be taken as prescribed. The medicine does not work as well when you skip doses. Skipping doses also puts you at risk for problems.   Do not smoke.   Monitor your blood pressure at home as directed by your health care provider. SEEK MEDICAL CARE IF:   You think you are having a reaction to medicines taken.  You have recurrent  headaches or feel dizzy.  You have swelling in your ankles.  You have trouble with your vision. SEEK IMMEDIATE MEDICAL CARE IF:  You develop a severe headache or confusion.  You have unusual weakness, numbness, or feel faint.  You have severe chest or abdominal pain.  You vomit repeatedly.  You have trouble breathing. MAKE SURE YOU:   Understand these instructions.  Will watch your condition.  Will get help right away if you are not doing well or get worse. Document Released: 07/23/2005 Document Revised: 12/07/2013 Document Reviewed: 05/15/2013 [facility] Patient Information 2015 [place], [place]. This information is not intended to replace advice given to you by your health care provider. Make sure you discuss any questions you have with your health care provider.

## 2014-08-06 NOTE — ED Notes (Signed)
Pt here with c/o chest pain that started while waiting in the lobby at the [facility]. States that it has resolved now. Denies n/v/d. Denies syncope. Also c/o hypertension, does not take any medication. Also states that he has history of anxiety and thinks symptoms are coming from that.

## 2014-08-11 ENCOUNTER — Ambulatory Visit: Payer: Medicare Other | Admitting: Cardiology
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
## 2014-09-14 ENCOUNTER — Emergency Department (INDEPENDENT_AMBULATORY_CARE_PROVIDER_SITE_OTHER)
Admission: EM | Admit: 2014-09-14 | Discharge: 2014-09-14 | Disposition: A | Discharge status: Home / Self Care | Payer: Medicare Other | Source: Home / Self Care | Attending: Family Medicine | Admitting: Family Medicine

## 2014-09-14 ENCOUNTER — Encounter (HOSPITAL_COMMUNITY): Payer: Self-pay | Admitting: Family Medicine

## 2014-09-14 DIAGNOSIS — E1159 Type 2 diabetes mellitus with other circulatory complications: Secondary | ICD-10-CM

## 2014-09-14 DIAGNOSIS — I10 Essential (primary) hypertension: Secondary | ICD-10-CM

## 2014-09-14 DIAGNOSIS — I160 Hypertensive urgency: Secondary | ICD-10-CM

## 2014-09-14 LAB — GLUCOSE, CAPILLARY: Glucose-Capillary: 148 mg/dL — ABNORMAL HIGH (ref 70–99)

## 2014-09-14 MED ORDER — CLONIDINE HCL 0.1 MG PO TABS
0.2000 mg | ORAL_TABLET | Freq: Once | ORAL | Status: AC
  Administered 2014-09-14: 0.2 mg via ORAL

## 2014-09-14 MED ORDER — CLONIDINE HCL 0.1 MG PO TABS: 0.1000 mg | ORAL_TABLET | Freq: Two times a day (BID) | ORAL | Status: DC

## 2014-09-14 MED ORDER — RAMIPRIL 5 MG PO CAPS: 10.0000 mg | ORAL_CAPSULE | Freq: Every day | ORAL | Status: DC

## 2014-09-14 MED ORDER — CLONIDINE HCL 0.1 MG PO TABS
ORAL_TABLET | ORAL | Status: AC
  Filled 2014-09-14: qty 2
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
## 2014-09-14 NOTE — ED Notes (Signed)
Pt states that he has uncontrolled hypertension and has been out of his high blood pressure medication for 3 to 4 months.

## 2014-09-14 NOTE — Discharge Instructions (Signed)
Your blood pressure is significantly out of control. This was improved with clonidine given to you here in our clinic today. Please restart your home blood pressure medications. Please monitor your glucose really. Your glucose at the time of your visit here in the clinic was 148. Please seek further medical attention. Please seek further medical attention through the new medical staff at your living space. Please seek immediate medical attention to the emergency room if her symptoms worsen.

## 2015-03-15 ENCOUNTER — Emergency Department (HOSPITAL_COMMUNITY)
Admission: EM | Admit: 2015-03-15 | Discharge: 2015-03-15 | Disposition: A | Discharge status: Home / Self Care | Payer: Medicare Other | Attending: Emergency Medicine | Admitting: Emergency Medicine

## 2015-03-15 ENCOUNTER — Encounter (HOSPITAL_COMMUNITY): Payer: Self-pay | Admitting: Physical Medicine and Rehabilitation

## 2015-03-15 DIAGNOSIS — F0390 Unspecified dementia without behavioral disturbance: Secondary | ICD-10-CM | POA: Insufficient documentation

## 2015-03-15 DIAGNOSIS — Z951 Presence of aortocoronary bypass graft: Secondary | ICD-10-CM | POA: Diagnosis not present

## 2015-03-15 DIAGNOSIS — I2510 Atherosclerotic heart disease of native coronary artery without angina pectoris: Secondary | ICD-10-CM | POA: Insufficient documentation

## 2015-03-15 DIAGNOSIS — Z720 Tobacco use: Secondary | ICD-10-CM | POA: Insufficient documentation

## 2015-03-15 DIAGNOSIS — Z79899 Other long term (current) drug therapy: Secondary | ICD-10-CM | POA: Insufficient documentation

## 2015-03-15 DIAGNOSIS — Y92410 Unspecified street and highway as the place of occurrence of the external cause: Secondary | ICD-10-CM | POA: Insufficient documentation

## 2015-03-15 DIAGNOSIS — E119 Type 2 diabetes mellitus without complications: Secondary | ICD-10-CM | POA: Insufficient documentation

## 2015-03-15 DIAGNOSIS — Z9861 Coronary angioplasty status: Secondary | ICD-10-CM | POA: Diagnosis not present

## 2015-03-15 DIAGNOSIS — I252 Old myocardial infarction: Secondary | ICD-10-CM | POA: Diagnosis not present

## 2015-03-15 DIAGNOSIS — Y998 Other external cause status: Secondary | ICD-10-CM | POA: Insufficient documentation

## 2015-03-15 DIAGNOSIS — Y9389 Activity, other specified: Secondary | ICD-10-CM | POA: Insufficient documentation

## 2015-03-15 DIAGNOSIS — Z87828 Personal history of other (healed) physical injury and trauma: Secondary | ICD-10-CM | POA: Insufficient documentation

## 2015-03-15 DIAGNOSIS — Z041 Encounter for examination and observation following transport accident: Secondary | ICD-10-CM | POA: Insufficient documentation

## 2015-03-15 DIAGNOSIS — I10 Essential (primary) hypertension: Secondary | ICD-10-CM | POA: Insufficient documentation

## 2015-03-15 LAB — CBG MONITORING, ED: Glucose-Capillary: 70 mg/dL (ref 65–99)
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
## 2015-08-12 ENCOUNTER — Emergency Department (HOSPITAL_COMMUNITY)
Admission: EM | Admit: 2015-08-12 | Discharge: 2015-08-13 | Disposition: A | Discharge status: Home / Self Care | Payer: Medicare Other | Attending: Emergency Medicine | Admitting: Emergency Medicine

## 2015-08-12 ENCOUNTER — Encounter (HOSPITAL_COMMUNITY): Payer: Self-pay | Admitting: Emergency Medicine

## 2015-08-12 ENCOUNTER — Emergency Department (HOSPITAL_COMMUNITY): Payer: Medicare Other

## 2015-08-12 DIAGNOSIS — Y9289 Other specified places as the place of occurrence of the external cause: Secondary | ICD-10-CM | POA: Insufficient documentation

## 2015-08-12 DIAGNOSIS — F1090 Alcohol use, unspecified, uncomplicated: Secondary | ICD-10-CM

## 2015-08-12 DIAGNOSIS — R410 Disorientation, unspecified: Secondary | ICD-10-CM | POA: Diagnosis not present

## 2015-08-12 DIAGNOSIS — R4182 Altered mental status, unspecified: Secondary | ICD-10-CM | POA: Diagnosis present

## 2015-08-12 DIAGNOSIS — X58XXXA Exposure to other specified factors, initial encounter: Secondary | ICD-10-CM | POA: Insufficient documentation

## 2015-08-12 DIAGNOSIS — Z951 Presence of aortocoronary bypass graft: Secondary | ICD-10-CM | POA: Diagnosis not present

## 2015-08-12 DIAGNOSIS — I2510 Atherosclerotic heart disease of native coronary artery without angina pectoris: Secondary | ICD-10-CM | POA: Diagnosis not present

## 2015-08-12 DIAGNOSIS — F10120 Alcohol abuse with intoxication, uncomplicated: Secondary | ICD-10-CM | POA: Diagnosis not present

## 2015-08-12 DIAGNOSIS — S0081XA Abrasion of other part of head, initial encounter: Secondary | ICD-10-CM | POA: Diagnosis not present

## 2015-08-12 DIAGNOSIS — Y998 Other external cause status: Secondary | ICD-10-CM | POA: Insufficient documentation

## 2015-08-12 DIAGNOSIS — Z79899 Other long term (current) drug therapy: Secondary | ICD-10-CM | POA: Diagnosis not present

## 2015-08-12 DIAGNOSIS — F0390 Unspecified dementia without behavioral disturbance: Secondary | ICD-10-CM | POA: Diagnosis not present

## 2015-08-12 DIAGNOSIS — F17210 Nicotine dependence, cigarettes, uncomplicated: Secondary | ICD-10-CM | POA: Diagnosis not present

## 2015-08-12 DIAGNOSIS — I10 Essential (primary) hypertension: Secondary | ICD-10-CM | POA: Insufficient documentation

## 2015-08-12 DIAGNOSIS — E119 Type 2 diabetes mellitus without complications: Secondary | ICD-10-CM | POA: Insufficient documentation

## 2015-08-12 DIAGNOSIS — I252 Old myocardial infarction: Secondary | ICD-10-CM | POA: Diagnosis not present

## 2015-08-12 DIAGNOSIS — R55 Syncope and collapse: Secondary | ICD-10-CM | POA: Insufficient documentation

## 2015-08-12 DIAGNOSIS — Y9389 Activity, other specified: Secondary | ICD-10-CM | POA: Insufficient documentation

## 2015-08-12 DIAGNOSIS — Z7289 Other problems related to lifestyle: Secondary | ICD-10-CM

## 2015-08-12 LAB — COMPREHENSIVE METABOLIC PANEL
ALBUMIN: 3.5 g/dL (ref 3.5–5.0)
ALK PHOS: 67 U/L (ref 38–126)
ALT: 71 U/L — AB (ref 17–63)
AST: 56 U/L — ABNORMAL HIGH (ref 15–41)
Anion gap: 11 (ref 5–15)
BUN: 10 mg/dL (ref 6–20)
CALCIUM: 8.8 mg/dL — AB (ref 8.9–10.3)
CO2: 23 mmol/L (ref 22–32)
CREATININE: 0.93 mg/dL (ref 0.61–1.24)
Chloride: 110 mmol/L (ref 101–111)
GFR calc Af Amer: >60 mL/min (ref >60)
GFR calc non Af Amer: >60 mL/min (ref >60)
GLUCOSE: 97 mg/dL (ref 65–99)
Potassium: 3.9 mmol/L (ref 3.5–5.1)
SODIUM: 144 mmol/L (ref 135–145)
TOTAL PROTEIN: 7.1 g/dL (ref 6.5–8.1)
Total Bilirubin: 0.7 mg/dL (ref 0.3–1.2)

## 2015-08-12 LAB — CBC WITH DIFFERENTIAL/PLATELET
BASOS PCT: 0 %
Basophils Absolute: 0 10*3/uL (ref 0.0–0.1)
Eosinophils Absolute: 0.1 10*3/uL (ref 0.0–0.7)
Eosinophils Relative: 3 %
HEMATOCRIT: 42.9 % (ref 39.0–52.0)
HEMOGLOBIN: 14.5 g/dL (ref 13.0–17.0)
LYMPHS ABS: 3.3 10*3/uL (ref 0.7–4.0)
Lymphocytes Relative: 61 %
MCH: 31.3 pg (ref 26.0–34.0)
MCHC: 33.8 g/dL (ref 30.0–36.0)
MCV: 92.7 fL (ref 78.0–100.0)
MONOS PCT: 9 %
Monocytes Absolute: 0.5 10*3/uL (ref 0.1–1.0)
NEUTROS ABS: 1.4 10*3/uL — AB (ref 1.7–7.7)
Neutrophils Relative %: 27 %
Platelets: 163 10*3/uL (ref 150–400)
RBC: 4.63 MIL/uL (ref 4.22–5.81)
RDW: 14.4 % (ref 11.5–15.5)
WBC: 5.3 10*3/uL (ref 4.0–10.5)

## 2015-08-12 LAB — ETHANOL: Alcohol, Ethyl (B): 255 mg/dL — ABNORMAL HIGH (ref <5)

## 2015-08-12 LAB — I-STAT CG4 LACTIC ACID, ED: Lactic Acid, Venous: 2.45 mmol/L (ref 0.5–2.0)

## 2015-08-12 LAB — CBG MONITORING, ED
GLUCOSE-CAPILLARY: 100 mg/dL — AB (ref 65–99)
GLUCOSE-CAPILLARY: 149 mg/dL — AB (ref 65–99)
GLUCOSE-CAPILLARY: 192 mg/dL — AB (ref 65–99)
Glucose-Capillary: 59 mg/dL — ABNORMAL LOW (ref 65–99)

## 2015-08-12 LAB — I-STAT TROPONIN, ED: Troponin i, poc: 0 ng/mL (ref 0.00–0.08)

## 2015-08-12 MED ORDER — DEXTROSE 50 % IV SOLN
50.0000 mL | Freq: Once | INTRAVENOUS | Status: AC
  Administered 2015-08-12: 50 mL via INTRAVENOUS
  Filled 2015-08-12: qty 50
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
## 2015-08-12 NOTE — ED Notes (Signed)
EKG given to Dr. Laneta Simmers, Dr. Laneta Simmers advised code STEMI can be cancelled.

## 2015-08-13 DIAGNOSIS — F10120 Alcohol abuse with intoxication, uncomplicated: Secondary | ICD-10-CM | POA: Diagnosis not present

## 2015-08-13 LAB — CBG MONITORING, ED
Glucose-Capillary: 69 mg/dL (ref 65–99)
Glucose-Capillary: 71 mg/dL (ref 65–99)
Glucose-Capillary: 80 mg/dL (ref 65–99)
Glucose-Capillary: 97 mg/dL (ref 65–99)

## 2015-08-13 MED ORDER — AMMONIA AROMATIC IN INHA
RESPIRATORY_TRACT | Status: AC
  Filled 2015-08-13: qty 10

## 2015-08-13 NOTE — ED Notes (Signed)
Pt given orange juice.

## 2015-08-13 NOTE — ED Notes (Signed)
Pt refused to sign d/c form or to receive d/c papers. Pt alert and oriented x 4 upon d/c. Pt would not allow this RN to take out his IV's and the pt ripped both IV's out and this RN placed 4x4 gauze on IV sites to control bleeding. Pt bled a small amount on his clothing and was cursing at this RN during d/c process. Security in room with this RN and pt. This RN and security escorted pt to waiting room to wait for ride or the bus. Pt yelling at [facility] in waiting room stating "I don't need no help getting to the --------- door?" Pt ambulatory under his own power with ease, stable, and NAD upon being d/c to the waiting room.

## 2015-08-13 NOTE — Discharge Instructions (Signed)
Alcohol Intoxication  Alcohol intoxication occurs when you drink enough alcohol that it affects your ability to function. It can be mild or very severe. Drinking a lot of alcohol in a short time is called binge drinking. This can be very harmful. Drinking alcohol can also be more dangerous if you are taking medicines or other drugs. Some of the effects caused by alcohol may include:  · Loss of coordination.  · Changes in mood and behavior.  · Unclear thinking.  · Trouble talking (slurred speech).  · Throwing up (vomiting).  · Confusion.  · Slowed breathing.  · Twitching and shaking (seizures).  · Loss of consciousness.  HOME CARE  · Do not drive after drinking alcohol.  · Drink enough water and fluids to keep your pee (urine) clear or pale yellow. Avoid caffeine.  · Only take medicine as told by your doctor.  GET HELP IF:  · You throw up (vomit) many times.  · You do not feel better after a few days.  · You frequently have alcohol intoxication. Your doctor can help decide if you should see a substance use treatment counselor.  GET HELP RIGHT AWAY IF:  · You become shaky when you stop drinking.  · You have twitching and shaking.  · You throw up blood. It may look bright red or like coffee grounds.  · You notice blood in your poop (bowel movements).  · You become lightheaded or pass out (faint).  MAKE SURE YOU:   · Understand these instructions.  · Will watch your condition.  · Will get help right away if you are not doing well or get worse.     This information is not intended to replace advice given to you by your health care provider. Make sure you discuss any questions you have with your health care provider.     Document Released: 01/09/2008 Document Revised: 03/25/2013 Document Reviewed: 12/26/2012  Elsevier Interactive Patient Education ©2016 [employer]

## 2016-08-29 ENCOUNTER — Encounter (HOSPITAL_COMMUNITY): Payer: Self-pay | Admitting: *Deleted

## 2016-08-29 ENCOUNTER — Emergency Department (HOSPITAL_COMMUNITY)
Admission: EM | Admit: 2016-08-29 | Discharge: 2016-08-29 | Disposition: A | Discharge status: Home / Self Care | Payer: Medicare Other | Attending: Emergency Medicine | Admitting: Emergency Medicine

## 2016-08-29 ENCOUNTER — Emergency Department (HOSPITAL_COMMUNITY): Payer: Medicare Other

## 2016-08-29 DIAGNOSIS — F17210 Nicotine dependence, cigarettes, uncomplicated: Secondary | ICD-10-CM | POA: Insufficient documentation

## 2016-08-29 DIAGNOSIS — I10 Essential (primary) hypertension: Secondary | ICD-10-CM | POA: Diagnosis not present

## 2016-08-29 DIAGNOSIS — E119 Type 2 diabetes mellitus without complications: Secondary | ICD-10-CM | POA: Diagnosis not present

## 2016-08-29 DIAGNOSIS — Z79899 Other long term (current) drug therapy: Secondary | ICD-10-CM | POA: Insufficient documentation

## 2016-08-29 DIAGNOSIS — I252 Old myocardial infarction: Secondary | ICD-10-CM | POA: Diagnosis not present

## 2016-08-29 DIAGNOSIS — M79674 Pain in right toe(s): Secondary | ICD-10-CM | POA: Diagnosis present

## 2016-08-29 DIAGNOSIS — I2510 Atherosclerotic heart disease of native coronary artery without angina pectoris: Secondary | ICD-10-CM | POA: Insufficient documentation

## 2016-08-29 MED ORDER — ACETAMINOPHEN 500 MG PO TABS
1000.0000 mg | ORAL_TABLET | Freq: Once | ORAL | Status: AC
  Administered 2016-08-29: 1000 mg via ORAL
  Filled 2016-08-29: qty 2

## 2016-08-29 NOTE — ED Triage Notes (Addendum)
Pt reports pain and swelling to right 2nd toe, denies injury. No redness noted to foot. Pt is diabetic.

## 2016-08-29 NOTE — Discharge Instructions (Signed)
Your x-ray is negative for broken bone.  Your pain is likely originating from the toe nail deformity/abnormal growth.  Take tylenol for pain control.   You can see your PCP or podiatrist above for follow-up regarding further management of your toe.
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
## 2016-09-20 ENCOUNTER — Encounter (HOSPITAL_COMMUNITY): Payer: Self-pay | Admitting: Emergency Medicine

## 2016-09-20 ENCOUNTER — Emergency Department (HOSPITAL_COMMUNITY)
Admission: EM | Admit: 2016-09-20 | Discharge: 2016-09-20 | Disposition: A | Discharge status: Home / Self Care | Payer: Medicare Other | Attending: Emergency Medicine | Admitting: Emergency Medicine

## 2016-09-20 DIAGNOSIS — R300 Dysuria: Secondary | ICD-10-CM | POA: Insufficient documentation

## 2016-09-20 DIAGNOSIS — I252 Old myocardial infarction: Secondary | ICD-10-CM | POA: Insufficient documentation

## 2016-09-20 DIAGNOSIS — M545 Low back pain, unspecified: Secondary | ICD-10-CM

## 2016-09-20 DIAGNOSIS — I2510 Atherosclerotic heart disease of native coronary artery without angina pectoris: Secondary | ICD-10-CM | POA: Insufficient documentation

## 2016-09-20 DIAGNOSIS — F17210 Nicotine dependence, cigarettes, uncomplicated: Secondary | ICD-10-CM | POA: Insufficient documentation

## 2016-09-20 DIAGNOSIS — Z951 Presence of aortocoronary bypass graft: Secondary | ICD-10-CM | POA: Diagnosis not present

## 2016-09-20 DIAGNOSIS — I10 Essential (primary) hypertension: Secondary | ICD-10-CM | POA: Diagnosis not present

## 2016-09-20 LAB — URINALYSIS, ROUTINE W REFLEX MICROSCOPIC
BILIRUBIN URINE: NEGATIVE
Glucose, UA: NEGATIVE mg/dL
Hgb urine dipstick: NEGATIVE
Ketones, ur: NEGATIVE mg/dL
Leukocytes, UA: NEGATIVE
NITRITE: NEGATIVE
PH: 5 (ref 5.0–8.0)
Protein, ur: NEGATIVE mg/dL
SPECIFIC GRAVITY, URINE: 1.024 (ref 1.005–1.030)

## 2016-09-20 MED ORDER — CLONIDINE HCL 0.1 MG PO TABS
0.1000 mg | ORAL_TABLET | Freq: Once | ORAL | Status: AC
  Administered 2016-09-20: 0.1 mg via ORAL
  Filled 2016-09-20: qty 1

## 2016-09-20 NOTE — ED Notes (Signed)
Reports he may have injured his back three days and two nights ago.  C/O pain left flank area.  States he was helping out where he is in assisted living and maybe did too much.  Wants an xray.

## 2016-09-20 NOTE — ED Notes (Signed)
Unable to check CBG. Pt absent from room

## 2016-09-20 NOTE — ED Notes (Signed)
PT absent from room unable to  [place].  Adam NT reported pt was absent from room when he attempted to check CBG on Pt.
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
## 2016-09-20 NOTE — ED Triage Notes (Signed)
Back pain x several days no new injury he states hurts to void he states

## 2016-09-21 LAB — URINE CULTURE: Culture: NO GROWTH

## 2016-11-09 ENCOUNTER — Emergency Department (HOSPITAL_COMMUNITY): Payer: Medicare Other

## 2016-11-09 ENCOUNTER — Emergency Department (HOSPITAL_COMMUNITY)
Admission: EM | Admit: 2016-11-09 | Discharge: 2016-11-09 | Disposition: A | Discharge status: Home / Self Care | Payer: Medicare Other | Attending: Emergency Medicine | Admitting: Emergency Medicine

## 2016-11-09 ENCOUNTER — Encounter (HOSPITAL_COMMUNITY): Payer: Self-pay

## 2016-11-09 DIAGNOSIS — Z955 Presence of coronary angioplasty implant and graft: Secondary | ICD-10-CM | POA: Insufficient documentation

## 2016-11-09 DIAGNOSIS — E119 Type 2 diabetes mellitus without complications: Secondary | ICD-10-CM | POA: Diagnosis not present

## 2016-11-09 DIAGNOSIS — Z79899 Other long term (current) drug therapy: Secondary | ICD-10-CM | POA: Diagnosis not present

## 2016-11-09 DIAGNOSIS — I252 Old myocardial infarction: Secondary | ICD-10-CM | POA: Diagnosis not present

## 2016-11-09 DIAGNOSIS — F10129 Alcohol abuse with intoxication, unspecified: Secondary | ICD-10-CM | POA: Insufficient documentation

## 2016-11-09 DIAGNOSIS — I2510 Atherosclerotic heart disease of native coronary artery without angina pectoris: Secondary | ICD-10-CM | POA: Diagnosis not present

## 2016-11-09 DIAGNOSIS — R42 Dizziness and giddiness: Secondary | ICD-10-CM | POA: Diagnosis present

## 2016-11-09 DIAGNOSIS — R791 Abnormal coagulation profile: Secondary | ICD-10-CM | POA: Diagnosis not present

## 2016-11-09 DIAGNOSIS — F17210 Nicotine dependence, cigarettes, uncomplicated: Secondary | ICD-10-CM | POA: Insufficient documentation

## 2016-11-09 DIAGNOSIS — I10 Essential (primary) hypertension: Secondary | ICD-10-CM | POA: Diagnosis not present

## 2016-11-09 DIAGNOSIS — F10920 Alcohol use, unspecified with intoxication, uncomplicated: Secondary | ICD-10-CM

## 2016-11-09 LAB — HEPATIC FUNCTION PANEL
ALBUMIN: 3.2 g/dL — AB (ref 3.5–5.0)
ALK PHOS: 101 U/L (ref 38–126)
ALT: 52 U/L (ref 17–63)
AST: 48 U/L — ABNORMAL HIGH (ref 15–41)
Bilirubin, Direct: 0.2 mg/dL (ref 0.1–0.5)
Indirect Bilirubin: 0.2 mg/dL — ABNORMAL LOW (ref 0.3–0.9)
TOTAL PROTEIN: 7.7 g/dL (ref 6.5–8.1)
Total Bilirubin: 0.4 mg/dL (ref 0.3–1.2)

## 2016-11-09 LAB — CBC
HEMATOCRIT: 40 % (ref 39.0–52.0)
HEMOGLOBIN: 13.4 g/dL (ref 13.0–17.0)
MCH: 30.3 pg (ref 26.0–34.0)
MCHC: 33.5 g/dL (ref 30.0–36.0)
MCV: 90.5 fL (ref 78.0–100.0)
Platelets: 166 10*3/uL (ref 150–400)
RBC: 4.42 MIL/uL (ref 4.22–5.81)
RDW: 14.4 % (ref 11.5–15.5)
WBC: 4.8 10*3/uL (ref 4.0–10.5)

## 2016-11-09 LAB — RAPID URINE DRUG SCREEN, HOSP PERFORMED
AMPHETAMINES: NOT DETECTED
Barbiturates: NOT DETECTED
Benzodiazepines: NOT DETECTED
Cocaine: NOT DETECTED
Opiates: NOT DETECTED
Tetrahydrocannabinol: NOT DETECTED

## 2016-11-09 LAB — DIFFERENTIAL
BASOS PCT: 0 %
Basophils Absolute: 0 10*3/uL (ref 0.0–0.1)
Eosinophils Absolute: 0.2 10*3/uL (ref 0.0–0.7)
Eosinophils Relative: 5 %
Lymphocytes Relative: 48 %
Lymphs Abs: 2.1 10*3/uL (ref 0.7–4.0)
MONO ABS: 0.4 10*3/uL (ref 0.1–1.0)
MONOS PCT: 8 %
NEUTROS ABS: 1.7 10*3/uL (ref 1.7–7.7)
Neutrophils Relative %: 39 %

## 2016-11-09 LAB — BASIC METABOLIC PANEL
ANION GAP: 8 (ref 5–15)
BUN: 11 mg/dL (ref 6–20)
CO2: 23 mmol/L (ref 22–32)
Calcium: 9 mg/dL (ref 8.9–10.3)
Chloride: 112 mmol/L — ABNORMAL HIGH (ref 101–111)
Creatinine, Ser: 0.85 mg/dL (ref 0.61–1.24)
GFR calc non Af Amer: >60 mL/min (ref >60)
Glucose, Bld: 81 mg/dL (ref 65–99)
POTASSIUM: 3.6 mmol/L (ref 3.5–5.1)
Sodium: 143 mmol/L (ref 135–145)

## 2016-11-09 LAB — URINALYSIS, ROUTINE W REFLEX MICROSCOPIC
Bilirubin Urine: NEGATIVE
Glucose, UA: NEGATIVE mg/dL
HGB URINE DIPSTICK: NEGATIVE
Ketones, ur: NEGATIVE mg/dL
Nitrite: NEGATIVE
Protein, ur: NEGATIVE mg/dL
pH: 5.5 (ref 5.0–8.0)

## 2016-11-09 LAB — CBG MONITORING, ED: GLUCOSE-CAPILLARY: 123 mg/dL — AB (ref 65–99)

## 2016-11-09 LAB — PROTIME-INR
INR: 1.06
PROTHROMBIN TIME: 13.8 s (ref 11.4–15.2)

## 2016-11-09 LAB — ETHANOL: Alcohol, Ethyl (B): 80 mg/dL — ABNORMAL HIGH (ref <5)

## 2016-11-09 LAB — APTT: aPTT: 35 seconds (ref 24–36)

## 2016-11-09 LAB — URINALYSIS, MICROSCOPIC (REFLEX): BACTERIA UA: NONE SEEN

## 2016-11-09 MED ORDER — CLONIDINE HCL 0.1 MG PO TABS
0.1000 mg | ORAL_TABLET | Freq: Two times a day (BID) | ORAL | Status: DC
  Administered 2016-11-09: 0.1 mg via ORAL
  Filled 2016-11-09: qty 1

## 2016-11-09 MED ORDER — RAMIPRIL 10 MG PO CAPS
10.0000 mg | ORAL_CAPSULE | Freq: Every day | ORAL | Status: DC
  Administered 2016-11-09: 10 mg via ORAL
  Filled 2016-11-09: qty 1

## 2016-11-09 NOTE — Discharge Instructions (Signed)
Follow-up with your primary care doctor, continue blood pressure medications, avoid heavy alcohol consumption

## 2016-11-09 NOTE — ED Triage Notes (Signed)
Pt from home by [facility] for HTN and headache that has now gone away. Pt A&O x4. Pt also has felt dizzy and slightly weaker today

## 2016-11-09 NOTE — ED Notes (Signed)
[employer] at bedside speaking with the pt
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
## 2016-11-27 ENCOUNTER — Emergency Department (HOSPITAL_COMMUNITY)
Admission: EM | Admit: 2016-11-27 | Discharge: 2016-11-27 | Disposition: A | Discharge status: Home / Self Care | Payer: Medicare Other | Attending: Emergency Medicine | Admitting: Emergency Medicine

## 2016-11-27 ENCOUNTER — Emergency Department (HOSPITAL_COMMUNITY): Payer: Medicare Other

## 2016-11-27 ENCOUNTER — Encounter (HOSPITAL_COMMUNITY): Payer: Self-pay | Admitting: Emergency Medicine

## 2016-11-27 DIAGNOSIS — I10 Essential (primary) hypertension: Secondary | ICD-10-CM | POA: Diagnosis present

## 2016-11-27 DIAGNOSIS — I2510 Atherosclerotic heart disease of native coronary artery without angina pectoris: Secondary | ICD-10-CM | POA: Insufficient documentation

## 2016-11-27 DIAGNOSIS — F17210 Nicotine dependence, cigarettes, uncomplicated: Secondary | ICD-10-CM | POA: Insufficient documentation

## 2016-11-27 DIAGNOSIS — R51 Headache: Secondary | ICD-10-CM | POA: Diagnosis not present

## 2016-11-27 DIAGNOSIS — I158 Other secondary hypertension: Secondary | ICD-10-CM | POA: Insufficient documentation

## 2016-11-27 DIAGNOSIS — E119 Type 2 diabetes mellitus without complications: Secondary | ICD-10-CM | POA: Insufficient documentation

## 2016-11-27 DIAGNOSIS — R519 Headache, unspecified: Secondary | ICD-10-CM

## 2016-11-27 DIAGNOSIS — I252 Old myocardial infarction: Secondary | ICD-10-CM | POA: Diagnosis not present

## 2016-11-27 DIAGNOSIS — Z951 Presence of aortocoronary bypass graft: Secondary | ICD-10-CM | POA: Insufficient documentation

## 2016-11-27 LAB — COMPREHENSIVE METABOLIC PANEL
ALBUMIN: 3.4 g/dL — AB (ref 3.5–5.0)
ALK PHOS: 66 U/L (ref 38–126)
ALT: 79 U/L — ABNORMAL HIGH (ref 17–63)
AST: 57 U/L — AB (ref 15–41)
Anion gap: 7 (ref 5–15)
BILIRUBIN TOTAL: 1.3 mg/dL — AB (ref 0.3–1.2)
BUN: 7 mg/dL (ref 6–20)
CALCIUM: 8.9 mg/dL (ref 8.9–10.3)
CO2: 28 mmol/L (ref 22–32)
Chloride: 106 mmol/L (ref 101–111)
Creatinine, Ser: 0.99 mg/dL (ref 0.61–1.24)
GFR calc Af Amer: >60 mL/min (ref >60)
GFR calc non Af Amer: >60 mL/min (ref >60)
GLUCOSE: 70 mg/dL (ref 65–99)
Potassium: 3.9 mmol/L (ref 3.5–5.1)
Sodium: 141 mmol/L (ref 135–145)
TOTAL PROTEIN: 6.8 g/dL (ref 6.5–8.1)

## 2016-11-27 LAB — CBC WITH DIFFERENTIAL/PLATELET
BASOS ABS: 0 10*3/uL (ref 0.0–0.1)
BASOS PCT: 1 %
EOS PCT: 3 %
Eosinophils Absolute: 0.1 10*3/uL (ref 0.0–0.7)
HEMATOCRIT: 37.3 % — AB (ref 39.0–52.0)
Hemoglobin: 12.5 g/dL — ABNORMAL LOW (ref 13.0–17.0)
Lymphocytes Relative: 56 %
Lymphs Abs: 2.5 10*3/uL (ref 0.7–4.0)
MCH: 30.2 pg (ref 26.0–34.0)
MCHC: 33.5 g/dL (ref 30.0–36.0)
MCV: 90.1 fL (ref 78.0–100.0)
MONO ABS: 0.4 10*3/uL (ref 0.1–1.0)
MONOS PCT: 8 %
NEUTROS ABS: 1.4 10*3/uL — AB (ref 1.7–7.7)
Neutrophils Relative %: 32 %
PLATELETS: 136 10*3/uL — AB (ref 150–400)
RBC: 4.14 MIL/uL — ABNORMAL LOW (ref 4.22–5.81)
RDW: 14.5 % (ref 11.5–15.5)
WBC: 4.4 10*3/uL (ref 4.0–10.5)

## 2016-11-27 LAB — ETHANOL: Alcohol, Ethyl (B): <5 mg/dL (ref <5)

## 2016-11-27 MED ORDER — SODIUM CHLORIDE 0.9 % IV BOLUS (SEPSIS)
1000.0000 mL | Freq: Once | INTRAVENOUS | Status: AC
  Administered 2016-11-27: 1000 mL via INTRAVENOUS

## 2016-11-27 MED ORDER — SODIUM CHLORIDE 0.9 % IV SOLN
INTRAVENOUS | Status: DC
  Administered 2016-11-27: 19:00:00 via INTRAVENOUS
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
## 2017-03-11 ENCOUNTER — Inpatient Hospital Stay (HOSPITAL_COMMUNITY)
Admission: EM | Disposition: A | Discharge status: Home Health | Payer: Self-pay | Source: Home / Self Care | Attending: Interventional Cardiology

## 2017-03-11 ENCOUNTER — Inpatient Hospital Stay (HOSPITAL_COMMUNITY)
Admission: EM | Admit: 2017-03-11 | Discharge: 2017-03-14 | DRG: 281 | Disposition: A | Discharge status: Home Health | Payer: Medicare Other | Attending: Interventional Cardiology | Admitting: Interventional Cardiology

## 2017-03-11 ENCOUNTER — Encounter (HOSPITAL_COMMUNITY): Payer: Self-pay

## 2017-03-11 ENCOUNTER — Emergency Department (HOSPITAL_COMMUNITY): Payer: Medicare Other

## 2017-03-11 DIAGNOSIS — I214 Non-ST elevation (NSTEMI) myocardial infarction: Secondary | ICD-10-CM | POA: Diagnosis present

## 2017-03-11 DIAGNOSIS — I5042 Chronic combined systolic (congestive) and diastolic (congestive) heart failure: Secondary | ICD-10-CM | POA: Diagnosis present

## 2017-03-11 DIAGNOSIS — E119 Type 2 diabetes mellitus without complications: Secondary | ICD-10-CM

## 2017-03-11 DIAGNOSIS — I252 Old myocardial infarction: Secondary | ICD-10-CM

## 2017-03-11 DIAGNOSIS — I10 Essential (primary) hypertension: Secondary | ICD-10-CM

## 2017-03-11 DIAGNOSIS — Z951 Presence of aortocoronary bypass graft: Secondary | ICD-10-CM | POA: Diagnosis not present

## 2017-03-11 DIAGNOSIS — R740 Nonspecific elevation of levels of transaminase and lactic acid dehydrogenase [LDH]: Secondary | ICD-10-CM | POA: Diagnosis present

## 2017-03-11 DIAGNOSIS — Z955 Presence of coronary angioplasty implant and graft: Secondary | ICD-10-CM | POA: Diagnosis not present

## 2017-03-11 DIAGNOSIS — I219 Acute myocardial infarction, unspecified: Secondary | ICD-10-CM

## 2017-03-11 DIAGNOSIS — Z9119 Patient's noncompliance with other medical treatment and regimen: Secondary | ICD-10-CM | POA: Diagnosis not present

## 2017-03-11 DIAGNOSIS — Z8249 Family history of ischemic heart disease and other diseases of the circulatory system: Secondary | ICD-10-CM | POA: Diagnosis not present

## 2017-03-11 DIAGNOSIS — I2510 Atherosclerotic heart disease of native coronary artery without angina pectoris: Secondary | ICD-10-CM | POA: Diagnosis present

## 2017-03-11 DIAGNOSIS — F0390 Unspecified dementia without behavioral disturbance: Secondary | ICD-10-CM | POA: Diagnosis present

## 2017-03-11 DIAGNOSIS — F17210 Nicotine dependence, cigarettes, uncomplicated: Secondary | ICD-10-CM | POA: Diagnosis present

## 2017-03-11 DIAGNOSIS — R079 Chest pain, unspecified: Secondary | ICD-10-CM | POA: Diagnosis present

## 2017-03-11 DIAGNOSIS — I2102 ST elevation (STEMI) myocardial infarction involving left anterior descending coronary artery: Secondary | ICD-10-CM

## 2017-03-11 DIAGNOSIS — Z791 Long term (current) use of non-steroidal anti-inflammatories (NSAID): Secondary | ICD-10-CM | POA: Diagnosis not present

## 2017-03-11 DIAGNOSIS — Z7984 Long term (current) use of oral hypoglycemic drugs: Secondary | ICD-10-CM

## 2017-03-11 DIAGNOSIS — E785 Hyperlipidemia, unspecified: Secondary | ICD-10-CM | POA: Diagnosis present

## 2017-03-11 DIAGNOSIS — I70219 Atherosclerosis of native arteries of extremities with intermittent claudication, unspecified extremity: Secondary | ICD-10-CM | POA: Diagnosis not present

## 2017-03-11 DIAGNOSIS — I272 Pulmonary hypertension, unspecified: Secondary | ICD-10-CM | POA: Diagnosis present

## 2017-03-11 DIAGNOSIS — I213 ST elevation (STEMI) myocardial infarction of unspecified site: Secondary | ICD-10-CM | POA: Diagnosis present

## 2017-03-11 DIAGNOSIS — I110 Hypertensive heart disease with heart failure: Secondary | ICD-10-CM | POA: Diagnosis present

## 2017-03-11 HISTORY — DX: Essential (primary) hypertension: I10

## 2017-03-11 HISTORY — PX: LEFT HEART CATH AND CORONARY ANGIOGRAPHY: CATH118249

## 2017-03-11 HISTORY — DX: Hyperlipidemia, unspecified: E78.5

## 2017-03-11 HISTORY — PX: CORONARY STENT INTERVENTION: CATH118234

## 2017-03-11 LAB — BASIC METABOLIC PANEL
Anion gap: 8 (ref 5–15)
BUN: 21 mg/dL — AB (ref 6–20)
CHLORIDE: 107 mmol/L (ref 101–111)
CO2: 24 mmol/L (ref 22–32)
CREATININE: 1.31 mg/dL — AB (ref 0.61–1.24)
Calcium: 9.4 mg/dL (ref 8.9–10.3)
GFR calc Af Amer: >60 mL/min (ref >60)
GFR calc non Af Amer: 54 mL/min — ABNORMAL LOW (ref >60)
GLUCOSE: 102 mg/dL — AB (ref 65–99)
POTASSIUM: 3.8 mmol/L (ref 3.5–5.1)
Sodium: 139 mmol/L (ref 135–145)

## 2017-03-11 LAB — CBC
HEMATOCRIT: 38.3 % — AB (ref 39.0–52.0)
Hemoglobin: 13.1 g/dL (ref 13.0–17.0)
MCH: 30.5 pg (ref 26.0–34.0)
MCHC: 34.2 g/dL (ref 30.0–36.0)
MCV: 89.1 fL (ref 78.0–100.0)
PLATELETS: 184 10*3/uL (ref 150–400)
RBC: 4.3 MIL/uL (ref 4.22–5.81)
RDW: 14.6 % (ref 11.5–15.5)
WBC: 4 10*3/uL (ref 4.0–10.5)

## 2017-03-11 LAB — MRSA PCR SCREENING: MRSA by PCR: NEGATIVE

## 2017-03-11 LAB — POCT ACTIVATED CLOTTING TIME: Activated Clotting Time: 428 seconds

## 2017-03-11 LAB — TROPONIN I: Troponin I: 12.49 ng/mL (ref <0.03)

## 2017-03-11 LAB — I-STAT TROPONIN, ED: Troponin i, poc: 7.51 ng/mL (ref 0.00–0.08)

## 2017-03-11 LAB — TSH: TSH: 1.135 u[IU]/mL (ref 0.350–4.500)

## 2017-03-11 SURGERY — LEFT HEART CATH AND CORONARY ANGIOGRAPHY
Anesthesia: LOCAL

## 2017-03-11 MED ORDER — ONDANSETRON HCL 4 MG/2ML IJ SOLN: 4.0000 mg | Freq: Four times a day (QID) | INTRAMUSCULAR | Status: DC | PRN

## 2017-03-11 MED ORDER — SODIUM CHLORIDE 0.9% FLUSH
3.0000 mL | INTRAVENOUS | Status: DC | PRN
Start: 2017-03-11 — End: 2017-03-12

## 2017-03-11 MED ORDER — CLOPIDOGREL BISULFATE 75 MG PO TABS
75.0000 mg | ORAL_TABLET | Freq: Every day | ORAL | Status: DC
Start: 2017-03-12 — End: 2017-03-14
  Administered 2017-03-12 – 2017-03-14 (×3): 75 mg via ORAL
  Filled 2017-03-11 (×3): qty 1

## 2017-03-11 MED ORDER — LABETALOL HCL 5 MG/ML IV SOLN
INTRAVENOUS | Status: AC
  Filled 2017-03-11: qty 4

## 2017-03-11 MED ORDER — MIDAZOLAM HCL 2 MG/2ML IJ SOLN
INTRAMUSCULAR | Status: DC | PRN
  Administered 2017-03-11 (×2): 1 mg via INTRAVENOUS

## 2017-03-11 MED ORDER — ASPIRIN 81 MG PO CHEW
324.0000 mg | CHEWABLE_TABLET | Freq: Once | ORAL | Status: AC
  Administered 2017-03-11: 324 mg via ORAL
  Filled 2017-03-11: qty 4

## 2017-03-11 MED ORDER — SODIUM CHLORIDE 0.9% FLUSH
3.0000 mL | Freq: Two times a day (BID) | INTRAVENOUS | Status: DC
  Administered 2017-03-11 – 2017-03-14 (×4): 3 mL via INTRAVENOUS

## 2017-03-11 MED ORDER — SODIUM CHLORIDE 0.9 % IV SOLN
INTRAVENOUS | Status: AC | PRN
  Administered 2017-03-11: 1.75 mg/kg/h via INTRAVENOUS

## 2017-03-11 MED ORDER — SODIUM CHLORIDE 0.9 % IV SOLN: INTRAVENOUS | Status: AC

## 2017-03-11 MED ORDER — HEPARIN (PORCINE) IN NACL 2-0.9 UNIT/ML-% IJ SOLN
INTRAMUSCULAR | Status: AC
  Filled 2017-03-11: qty 1000

## 2017-03-11 MED ORDER — HEPARIN SODIUM (PORCINE) 1000 UNIT/ML IJ SOLN
4000.0000 [IU] | Freq: Once | INTRAMUSCULAR | Status: AC
  Administered 2017-03-11: 4000 [IU] via INTRAVENOUS
  Filled 2017-03-11: qty 4

## 2017-03-11 MED ORDER — FENTANYL CITRATE (PF) 100 MCG/2ML IJ SOLN
INTRAMUSCULAR | Status: AC
  Filled 2017-03-11: qty 2

## 2017-03-11 MED ORDER — OXYCODONE-ACETAMINOPHEN 5-325 MG PO TABS: 1.0000 | ORAL_TABLET | ORAL | Status: DC | PRN

## 2017-03-11 MED ORDER — BIVALIRUDIN BOLUS VIA INFUSION - CUPID
INTRAVENOUS | Status: DC | PRN
  Administered 2017-03-11: 57 mg via INTRAVENOUS

## 2017-03-11 MED ORDER — SODIUM CHLORIDE 0.9 % IV SOLN: 250.0000 mL | INTRAVENOUS | Status: DC | PRN

## 2017-03-11 MED ORDER — SODIUM CHLORIDE 0.9% FLUSH
3.0000 mL | Freq: Two times a day (BID) | INTRAVENOUS | Status: DC
  Administered 2017-03-11 – 2017-03-12 (×3): 3 mL via INTRAVENOUS

## 2017-03-11 MED ORDER — MIDAZOLAM HCL 2 MG/2ML IJ SOLN
INTRAMUSCULAR | Status: AC
  Filled 2017-03-11: qty 2

## 2017-03-11 MED ORDER — ATORVASTATIN CALCIUM 80 MG PO TABS: 80.0000 mg | ORAL_TABLET | Freq: Every day | ORAL | Status: DC

## 2017-03-11 MED ORDER — IOPAMIDOL (ISOVUE-370) INJECTION 76%
INTRAVENOUS | Status: AC
  Filled 2017-03-11: qty 50

## 2017-03-11 MED ORDER — HYDRALAZINE HCL 20 MG/ML IJ SOLN
INTRAMUSCULAR | Status: AC
  Filled 2017-03-11: qty 1

## 2017-03-11 MED ORDER — ATORVASTATIN CALCIUM 80 MG PO TABS
80.0000 mg | ORAL_TABLET | Freq: Every day | ORAL | Status: DC
  Administered 2017-03-11 – 2017-03-13 (×3): 80 mg via ORAL
  Filled 2017-03-11 (×3): qty 1

## 2017-03-11 MED ORDER — IOPAMIDOL (ISOVUE-370) INJECTION 76%
INTRAVENOUS | Status: AC
  Filled 2017-03-11: qty 100

## 2017-03-11 MED ORDER — BIVALIRUDIN TRIFLUOROACETATE 250 MG IV SOLR
INTRAVENOUS | Status: AC
  Filled 2017-03-11: qty 250

## 2017-03-11 MED ORDER — CLOPIDOGREL BISULFATE 300 MG PO TABS
ORAL_TABLET | ORAL | Status: DC | PRN
  Administered 2017-03-11: 600 mg via ORAL

## 2017-03-11 MED ORDER — CLOPIDOGREL BISULFATE 300 MG PO TABS
ORAL_TABLET | ORAL | Status: AC
  Filled 2017-03-11: qty 2

## 2017-03-11 MED ORDER — NITROGLYCERIN 0.4 MG SL SUBL: 0.4000 mg | SUBLINGUAL_TABLET | SUBLINGUAL | Status: DC | PRN

## 2017-03-11 MED ORDER — HEPARIN SODIUM (PORCINE) 5000 UNIT/ML IJ SOLN
5000.0000 [IU] | Freq: Three times a day (TID) | INTRAMUSCULAR | Status: DC
  Administered 2017-03-11 – 2017-03-14 (×7): 5000 [IU] via SUBCUTANEOUS
  Filled 2017-03-11 (×7): qty 1

## 2017-03-11 MED ORDER — RAMIPRIL 2.5 MG PO CAPS
5.0000 mg | ORAL_CAPSULE | Freq: Two times a day (BID) | ORAL | Status: DC
  Administered 2017-03-12 – 2017-03-14 (×5): 5 mg via ORAL
  Filled 2017-03-11 (×2): qty 2
  Filled 2017-03-11: qty 1
  Filled 2017-03-11 (×2): qty 2

## 2017-03-11 MED ORDER — NITROGLYCERIN 0.4 MG SL SUBL
0.4000 mg | SUBLINGUAL_TABLET | SUBLINGUAL | Status: DC | PRN
  Administered 2017-03-11: 0.4 mg via SUBLINGUAL
  Filled 2017-03-11: qty 1

## 2017-03-11 MED ORDER — IOPAMIDOL (ISOVUE-370) INJECTION 76%
INTRAVENOUS | Status: DC | PRN
  Administered 2017-03-11: 240 mL via INTRA_ARTERIAL

## 2017-03-11 MED ORDER — METOPROLOL TARTRATE 25 MG PO TABS
25.0000 mg | ORAL_TABLET | Freq: Two times a day (BID) | ORAL | Status: DC
  Administered 2017-03-11: 25 mg via ORAL
  Filled 2017-03-11: qty 1

## 2017-03-11 MED ORDER — HYDRALAZINE HCL 20 MG/ML IJ SOLN
5.0000 mg | INTRAMUSCULAR | Status: AC | PRN
  Administered 2017-03-11: 5 mg via INTRAVENOUS

## 2017-03-11 MED ORDER — ALPRAZOLAM 0.25 MG PO TABS: 0.2500 mg | ORAL_TABLET | Freq: Two times a day (BID) | ORAL | Status: DC | PRN

## 2017-03-11 MED ORDER — LIDOCAINE HCL (PF) 1 % IJ SOLN
INTRAMUSCULAR | Status: AC
  Filled 2017-03-11: qty 30

## 2017-03-11 MED ORDER — NITROGLYCERIN IN D5W 200-5 MCG/ML-% IV SOLN
INTRAVENOUS | Status: AC
  Filled 2017-03-11: qty 250

## 2017-03-11 MED ORDER — LABETALOL HCL 5 MG/ML IV SOLN
10.0000 mg | INTRAVENOUS | Status: AC | PRN
  Administered 2017-03-11 (×2): 10 mg via INTRAVENOUS

## 2017-03-11 MED ORDER — RAMIPRIL 5 MG PO CAPS: 5.0000 mg | ORAL_CAPSULE | Freq: Two times a day (BID) | ORAL | Status: DC

## 2017-03-11 MED ORDER — ASPIRIN 81 MG PO CHEW
81.0000 mg | CHEWABLE_TABLET | Freq: Every day | ORAL | Status: DC
  Administered 2017-03-12 – 2017-03-14 (×3): 81 mg via ORAL
  Filled 2017-03-11 (×3): qty 1

## 2017-03-11 MED ORDER — NITROGLYCERIN 1 MG/10 ML FOR IR/CATH LAB
INTRA_ARTERIAL | Status: AC
  Filled 2017-03-11: qty 10

## 2017-03-11 MED ORDER — SODIUM CHLORIDE 0.9% FLUSH: 3.0000 mL | INTRAVENOUS | Status: DC | PRN

## 2017-03-11 MED ORDER — ACETAMINOPHEN 325 MG PO TABS: 650.0000 mg | ORAL_TABLET | ORAL | Status: DC | PRN

## 2017-03-11 MED ORDER — FENTANYL CITRATE (PF) 100 MCG/2ML IJ SOLN
INTRAMUSCULAR | Status: DC | PRN
  Administered 2017-03-11 (×2): 50 ug via INTRAVENOUS

## 2017-03-11 MED ORDER — ENOXAPARIN SODIUM 40 MG/0.4ML ~~LOC~~ SOLN: 40.0000 mg | SUBCUTANEOUS | Status: DC

## 2017-03-11 MED ORDER — CLONIDINE HCL 0.1 MG PO TABS: 0.1000 mg | ORAL_TABLET | Freq: Two times a day (BID) | ORAL | Status: DC

## 2017-03-11 MED ORDER — ZOLPIDEM TARTRATE 5 MG PO TABS: 5.0000 mg | ORAL_TABLET | Freq: Every evening | ORAL | Status: DC | PRN

## 2017-03-11 MED ORDER — LIDOCAINE HCL (PF) 1 % IJ SOLN
INTRAMUSCULAR | Status: DC | PRN
  Administered 2017-03-11: 10 mL

## 2017-03-11 MED ORDER — NITROGLYCERIN IN D5W 200-5 MCG/ML-% IV SOLN
5.0000 ug/min | INTRAVENOUS | Status: DC
  Administered 2017-03-11: 10 ug/min via INTRAVENOUS

## 2017-03-11 MED ORDER — HEPARIN (PORCINE) IN NACL 2-0.9 UNIT/ML-% IJ SOLN
INTRAMUSCULAR | Status: AC | PRN
  Administered 2017-03-11: 1000 mL via INTRA_ARTERIAL

## 2017-03-11 SURGICAL SUPPLY — 17 items
BALLN EMERGE MR 3.0X12 (BALLOONS) ×2
BALLN ~~LOC~~ EMERGE MR 4.0X15 (BALLOONS) ×2
BALLOON EMERGE MR 3.0X12 (BALLOONS) IMPLANT
BALLOON ~~LOC~~ EMERGE MR 4.0X15 (BALLOONS) IMPLANT
CATH INFINITI 5 FR IM (CATHETERS) ×1 IMPLANT
CATH INFINITI 5FR JL4 (CATHETERS) ×1 IMPLANT
CATH INFINITI 5FR MPB2 (CATHETERS) ×1 IMPLANT
CATH LAUNCHER 6FR EBU3.5 (CATHETERS) ×1 IMPLANT
KIT ENCORE 26 ADVANTAGE (KITS) ×1 IMPLANT
KIT HEART LEFT (KITS) ×2 IMPLANT
PACK CARDIAC CATHETERIZATION (CUSTOM PROCEDURE TRAY) ×2 IMPLANT
SHEATH PINNACLE 6F 10CM (SHEATH) ×1 IMPLANT
STENT PROMUS PREM MR 3.5X20 (Permanent Stent) ×1 IMPLANT
TRANSDUCER W/STOPCOCK (MISCELLANEOUS) ×2 IMPLANT
TUBING CIL FLEX 10 FLL-RA (TUBING) ×2 IMPLANT
WIRE ASAHI PROWATER 180CM (WIRE) ×1 IMPLANT
WIRE EMERALD 3MM-J .035X150CM (WIRE) ×1 IMPLANT

## 2017-03-11 NOTE — ED Notes (Signed)
C/o chest pain onset Sat. Pm states it was worse sat however he just decided not to come until today. Rates pain at 8/10 today.
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
## 2017-03-11 NOTE — Progress Notes (Signed)
   03/11/17 1300  Clinical Encounter Type  Visited With Patient not available  Visit Type Initial;Code  Referral From Nurse  Consult/Referral To Chaplain  Stress Factors  Patient Stress Factors None identified    Chaplain responded to code STEMI patient en route to Cath Lab. Contact on call chaplain if family arrives. Marysa Wessner L. Volanda Napoleon, MDiv

## 2017-03-11 NOTE — Progress Notes (Signed)
Angiomax off at 1345

## 2017-03-11 NOTE — Progress Notes (Addendum)
Site area: Right groin a 6 french arterial sheath was removed  Site Prior to Removal:  Level 0  Pressure Applied For 35 MINUTES    Bedrest Beginning at 1720p  Manual:   Yes.    Patient Status During Pull:  stable  Post Pull Groin Site:  Level 0  Post Pull Instructions Given:  Yes.    Post Pull Pulses Present:  Yes.    Dressing Applied:  Yes.  Pressure dressing to groin  Comments:  BP treated with BP med

## 2017-03-11 NOTE — ED Triage Notes (Signed)
Pt presents for evaluation of central cp with no radiation. Reports 10/10 pain numbness. Pt reports sob, dizziness, nausea.
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
## 2017-03-11 NOTE — ED Notes (Signed)
Patient transported emergently to cath lab. All patient belongings sent with patient.

## 2017-03-11 NOTE — Progress Notes (Signed)
eLink Physician-Brief Progress Note Patient Name: Timothy Zimmerman DOB: 05/08/1951 MRN: [id number]   Date of Service  03/11/2017  HPI/Events of Note  NSTEMI s/p cardiac cath  eICU Interventions  Stable on cam check. No eicu interventions     Intervention Category Evaluation Type: New Patient Evaluation  Kattaleya Alia 03/11/2017, 8:38 PM

## 2017-03-12 ENCOUNTER — Encounter (HOSPITAL_COMMUNITY): Payer: Self-pay

## 2017-03-12 DIAGNOSIS — I214 Non-ST elevation (NSTEMI) myocardial infarction: Principal | ICD-10-CM

## 2017-03-12 LAB — COMPREHENSIVE METABOLIC PANEL
ALK PHOS: 71 U/L (ref 38–126)
ALT: 82 U/L — ABNORMAL HIGH (ref 17–63)
ANION GAP: 10 (ref 5–15)
AST: 107 U/L — ABNORMAL HIGH (ref 15–41)
Albumin: 3 g/dL — ABNORMAL LOW (ref 3.5–5.0)
BILIRUBIN TOTAL: 0.8 mg/dL (ref 0.3–1.2)
BUN: 16 mg/dL (ref 6–20)
CALCIUM: 8.9 mg/dL (ref 8.9–10.3)
CO2: 24 mmol/L (ref 22–32)
Chloride: 106 mmol/L (ref 101–111)
Creatinine, Ser: 1.12 mg/dL (ref 0.61–1.24)
GFR calc non Af Amer: >60 mL/min (ref >60)
Glucose, Bld: 85 mg/dL (ref 65–99)
Potassium: 4 mmol/L (ref 3.5–5.1)
SODIUM: 140 mmol/L (ref 135–145)
TOTAL PROTEIN: 7 g/dL (ref 6.5–8.1)

## 2017-03-12 LAB — CBC
HCT: 38.8 % — ABNORMAL LOW (ref 39.0–52.0)
Hemoglobin: 13.1 g/dL (ref 13.0–17.0)
MCH: 30.3 pg (ref 26.0–34.0)
MCHC: 33.8 g/dL (ref 30.0–36.0)
MCV: 89.6 fL (ref 78.0–100.0)
PLATELETS: 175 10*3/uL (ref 150–400)
RBC: 4.33 MIL/uL (ref 4.22–5.81)
RDW: 14.9 % (ref 11.5–15.5)
WBC: 4.5 10*3/uL (ref 4.0–10.5)

## 2017-03-12 LAB — HEMOGLOBIN A1C
HEMOGLOBIN A1C: 5.5 % (ref 4.8–5.6)
Mean Plasma Glucose: 111 mg/dL

## 2017-03-12 LAB — TROPONIN I
TROPONIN I: 12.9 ng/mL — AB (ref <0.03)
TROPONIN I: 14.31 ng/mL — AB (ref <0.03)

## 2017-03-12 MED ORDER — METOPROLOL TARTRATE 50 MG PO TABS
50.0000 mg | ORAL_TABLET | Freq: Two times a day (BID) | ORAL | Status: DC
  Administered 2017-03-12 – 2017-03-14 (×5): 50 mg via ORAL
  Filled 2017-03-12 (×5): qty 1

## 2017-03-12 MED FILL — Nitroglycerin IV Soln 100 MCG/ML in D5W: INTRA_ARTERIAL | Qty: 10 | Status: AC

## 2017-03-12 NOTE — Progress Notes (Signed)
Progress Note  Patient Name: Timothy Zimmerman Date of Encounter: 03/12/2017  Primary Cardiologist: Kinga Cassar Martinique MD  Subjective   Feels much better today. No chest pain. No SOB. Patient is calm and conversant.   Inpatient Medications    Scheduled Meds: . aspirin  81 mg Oral Daily  . atorvastatin  80 mg Oral q1800  . clopidogrel  75 mg Oral Q breakfast  . heparin  5,000 Units Subcutaneous Q8H  . metoprolol tartrate  50 mg Oral BID  . ramipril  5 mg Oral BID  . sodium chloride flush  3 mL Intravenous Q12H  . sodium chloride flush  3 mL Intravenous Q12H   Continuous Infusions: . sodium chloride    . sodium chloride     PRN Meds: sodium chloride, sodium chloride, acetaminophen, ALPRAZolam, nitroGLYCERIN, ondansetron (ZOFRAN) IV, oxyCODONE-acetaminophen, sodium chloride flush, sodium chloride flush, zolpidem   Vital Signs    Vitals:   03/12/17 0400 03/12/17 0500 03/12/17 0600 03/12/17 0752  BP: (!) 150/94 (!) 158/108 (!) 162/92   Pulse: 60 67 69   Resp: 12 16 (!) 30   Temp: 99.2 F (37.3 C)   98.9 F (37.2 C)  TempSrc: Oral   Oral  SpO2: 100% 100% 100%   Weight: 145 lb 15.1 oz (66.2 kg)     Height:        Intake/Output Summary (Last 24 hours) at 03/12/17 0806 Last data filed at 03/12/17 0200  Gross per 24 hour  Intake             28.6 ml  Output             1050 ml  Net          -1021.4 ml   Filed Weights   03/11/17 1747 03/12/17 0400  Weight: 147 lb 4.3 oz (66.8 kg) 145 lb 15.1 oz (66.2 kg)    Telemetry    NSR- Personally Reviewed  ECG    NSR, old inferior infarct. T wave inversion anteriorly improved.   - Personally Reviewed  Physical Exam   GEN: Thin BM in no acute distress.   Neck: No JVD or bruits Cardiac: RRR, no murmurs, rubs, or gallops.  Respiratory: Clear to auscultation bilaterally. GI: Soft, nontender, non-distended  MS: No edema; No deformity. Right femoral pulse 2+ without hematoma. Neuro:  Nonfocal  Psych: Normal affect   Labs      Chemistry Recent Labs Lab 03/11/17 1051 03/12/17 0536  NA 139 140  K 3.8 4.0  CL 107 106  CO2 24 24  GLUCOSE 102* 85  BUN 21* 16  CREATININE 1.31* 1.12  CALCIUM 9.4 8.9  PROT  --  7.0  ALBUMIN  --  3.0*  AST  --  107*  ALT  --  82*  ALKPHOS  --  71  BILITOT  --  0.8  GFRNONAA 54* >60  GFRAA >60 >60  ANIONGAP 8 10     Hematology Recent Labs Lab 03/11/17 1051 03/12/17 0536  WBC 4.0 4.5  RBC 4.30 4.33  HGB 13.1 13.1  HCT 38.3* 38.8*  MCV 89.1 89.6  MCH 30.5 30.3  MCHC 34.2 33.8  RDW 14.6 14.9  PLT 184 175    Cardiac Enzymes Recent Labs Lab 03/11/17 2001 03/11/17 2346 03/12/17 0536  TROPONINI 12.49* 14.31* 12.90*    Recent Labs Lab 03/11/17 1058  TROPIPOC 7.51*     BNPNo results for input(s): BNP, PROBNP in the last 168 hours.   DDimer  No results for input(s): DDIMER in the last 168 hours.   Radiology    Dg Chest Portable 1 View  Result Date: 03/11/2017 CLINICAL DATA:  Chest pain. Pre cardiac catheterization. prior myocardial infarction. EXAM: PORTABLE CHEST 1 VIEW COMPARISON:  08/12/2015 FINDINGS: Prior median sternotomy. Numerous leads and wires project over the chest. Midline trachea. Normal heart size for level of inspiration. No pleural effusion or pneumothorax. Subtle interstitial prominence and patchy opacities at the bases. Mild right hemidiaphragm elevation. Convex right thoracic spine curvature. IMPRESSION: Suspicion of mild pulmonary venous congestion. Electronically Signed   By: Abigail Miyamoto M.D.   On: 03/11/2017 11:36    Cardiac Studies   Procedures   CORONARY STENT INTERVENTION  LEFT HEART CATH AND CORONARY ANGIOGRAPHY  Conclusion    Non-ST elevation myocardial infarction probably urgently to the cath lab because of ST-T wave abnormality involving the anterior wall and difficult to obtain history that suggested he was having ongoing chest pain.  Bypass graft failure with occlusion of the SVG to PDA, total occlusion of the SVG to  the circumflex, and atresia of the left internal mammary graft to the LAD.  Patent SVG to the second diagonal which filled the LAD proximally revealing 99% stenosis beyond the origin of the diagonal in the proximal LAD.  Widely patent native right coronary with moderate 60-70% proximal narrowing.  Widely patent native circumflex, with irregularities noted in the mid vessel.  95-99% stenosis in the proximal to mid LAD just beyond the origin of the second diagonal. LAD was the culprit lesion for the presentation.  Successful PCI via the native LAD reducing the 99% stenosis to less than 10% with TIMI grade 3 flow using a Promus Premier 3.5 x 20 mm DES postdilated to 4 mm in diameter.  RECOMMENDATIONS:   Aspirin and Plavix for at least 1 year  Aggressive risk factor modification  Social services/care management to assist with medications and living conditions. No family available to communicate with following the procedure.  Discontinued bivalirudin upon completion of the currently infusing back.     Patient Profile     66 y.o. male with history of CAD s/p CABG in 2009 presents with NSTEMI. History of tobacco abuse, HTN, noncompliance.   Assessment & Plan    1. NSTEMI. Peak troponin 14.31 and trending down. Ecg shows improvement in anterior ST-T changes. Cardiac cath reviewed. Only SVG to diagonal patent. Other SVGs occluded and LIMA to LAD atretic. S/p DES of proximal to mid LAD. No obstructive disease in LCx. 60-70% proximal RCA. Will assess LV function with Echo today. Now pain free. Will continue ASA and Plavix for at least one year. Titrate beta blocker and ACEi for optimal BP control. On high dose statin. Will need to stress compliance with medication and outpatient follow up. Cardiac Rehab. Will transfer to telemetry today. 2. HTN uncontrolled.  3. Tobacco abuse. Smoking cessation recommended.  4. HLD on high dose statin 5. ? History of DM- A1c 5.5%. Normal sugar. On on meds.  I don't think he has DM 6. Noncompliance. Patient without cardiology follow up since CABG in 2009. Admitted briefly in 2015 but left AMA. Patient threatened to leave AMA early this am but appears calm this am and states he wants to stay and get better.   Signed, Ashlay Altieri Martinique, MD  03/12/2017, 8:06 AM

## 2017-03-12 NOTE — Progress Notes (Signed)
Patient transferring to  3E report called to [facility] RN patient aware of transfer.  Timothy Zimmerman, Tivis Ringer, RN

## 2017-03-12 NOTE — Progress Notes (Signed)
CARDIAC REHAB PHASE I   PRE:  Rate/Rhythm: 71 SR    BP: sitting 148/100    SaO2: 100 RA  MODE:  Ambulation: 270 ft   POST:  Rate/Rhythm: 80 SR    BP: sitting 169/112     SaO2: 93 RA  Pt agreeable. Able to answer questions appropriately. Lives alone, sts his sister is nearby. He uses the bus system and walks to get meds/groceries/food. He is unsteady on his feet with narrow base of support. Fairly steady with RW, which he does not have at home. Pt sts he would prefer a rollator and I agree as he walks long distances at times. I also suggest PT c/s to work on balance/safety.  Pt is simple and tangential in conversation but seemed to understand basics of education. He will need reinforcement of Plavix/ASA (could not perform teach back). He was very excited about the fake cigarette I gave him and sts he has been looking for one. Left materials for pt to read as he sts he likes to read. Will refer to [place]. [place], ACSM 03/12/2017 11:55 AM

## 2017-03-12 NOTE — Care Management Note (Signed)
Case Management Note  Patient Details  Name: Timothy Zimmerman MRN: [id number] Date of Birth: [birth year]  Subjective/Objective:   From home alone, pta indep with cane, but he has lost his cane,  He has a sister who lives here in [place] at [place] apartments who he  Visits quite often.  Presents with NSTEMI, s/p coronary stent, will be on Cardiac rehab to see.                  Action/Plan: NCM will follow for dc needs.   Expected Discharge Date:                  Expected Discharge Plan:     In-House Referral:     Discharge planning Services  CM Consult  Post Acute Care Choice:    Choice offered to:     DME Arranged:    DME Agency:     HH Arranged:    HH Agency:     Status of Service:  In process, will continue to follow  If discussed at Long Length of Stay Meetings, dates discussed:    Additional Comments:  Zenon Mayo, RN 03/12/2017, 11:03 AM

## 2017-03-12 NOTE — Progress Notes (Signed)
Patient began to get verbally abusive with RN and demanded that RN let him leave the hospital.   RN explained risks of leaving against medical advice, but patient persisted in wanting to leave. He began taking off his EKG leads, O2 sensor, and got out of bed. Patient did not let RN help.   Once patient was out of bed, he began putting his clothes on and taking all of his belongings out of his bag. Three other RNs came in to help and patient continued to insist on leaving.   E-Link MD made aware and E-Link RN able to see patient through camera getting dressed.   Cardiology fellow on call paged. Cards fellow stated that the patient needed to understand the risks of leaving before signing the [place] document. RN made patient aware of risks. Patient reluctant to sign AMA document.   Patient stated that "nobody had explained anything" to which Charge RN and I offered to answer any questions he had. Patient had no questions.   While patient continued to act like he was leaving, charge RN and I offered to help him take his IVs out and call someone to get pick him up. Patient refused help and removed one IV himself.  Patient did not sign AMA paperwork, denied having any questions, and eventually agreed on staying on the unit overnight after charge RN and I talked to him for 30-45 minutes.   Patient currently back in bed, is safe, and in stable condition.   Will continue to monitor.   Ashaz Robling E Reola Mosher, [place]

## 2017-03-13 ENCOUNTER — Inpatient Hospital Stay (HOSPITAL_COMMUNITY): Payer: Medicare Other

## 2017-03-13 DIAGNOSIS — E119 Type 2 diabetes mellitus without complications: Secondary | ICD-10-CM

## 2017-03-13 MED ORDER — AMLODIPINE BESYLATE 5 MG PO TABS
5.0000 mg | ORAL_TABLET | Freq: Every day | ORAL | Status: DC
  Administered 2017-03-13 – 2017-03-14 (×2): 5 mg via ORAL
  Filled 2017-03-13 (×2): qty 1

## 2017-03-13 NOTE — Evaluation (Signed)
Physical Therapy Evaluation Patient Details Name: Timothy Zimmerman MRN: [id number] DOB: 1950/11/14 Today's Date: 03/13/2017   History of Present Illness  Pt is a 66 y/o male admitted secondary to chest pain and SOB, s/p cath pt found to have a NSTEMI. PMH including but not limited to HLD, HTN, DM, dementia, hx of STEMI in 2009 and s/p CABG in 2009.  Clinical Impression  Pt presented supine in bed with HOB elevated, awake and willing to participate in therapy session. Prior to admission, pt reported that he was independent with all functional mobility and ADLs; however, no family caregivers present to confirm information. Pt ambulated in hallway with use of RW and min guard for safety. Pt limited in ambulation distance secondary to reports of L LE (foot and ankle) pain with WB'ing. Pt stated that this was a chronic problem but unable to provide any further information. Pt would continue to benefit from skilled physical therapy services at this time while admitted and after d/c to address the below listed limitations in order to improve overall safety and independence with functional mobility.     Follow Up Recommendations Home health PT    Equipment Recommendations  Other (comment) (rollator)    Recommendations for Other Services       Precautions / Restrictions Precautions Precautions: Fall Restrictions Weight Bearing Restrictions: No      Mobility  Bed Mobility Overal bed mobility: Modified Independent                Transfers Overall transfer level: Needs assistance Equipment used: None Transfers: Sit to/from Stand Sit to Stand: Supervision         General transfer comment: supervision for safety  Ambulation/Gait Ambulation/Gait assistance: Min guard Ambulation Distance (Feet): 75 Feet Assistive device: Rolling walker (2 wheeled) Gait Pattern/deviations: Decreased stride length;Decreased weight shift to left;Antalgic Gait velocity: decreased Gait velocity  interpretation: Below normal speed for age/gender General Gait Details: mild instability but no overt LOB or need for physical assistance, min guard for safety; antalgic secondary to reports of L LE pain (foot and ankle)  Stairs            Wheelchair Mobility    Modified Rankin (Stroke Patients Only)       Balance Overall balance assessment: Needs assistance Sitting-balance support: Feet supported Sitting balance-Leahy Scale: Good     Standing balance support: During functional activity;No upper extremity supported Standing balance-Leahy Scale: Fair                               Pertinent Vitals/Pain Pain Assessment: No/denies pain    Home Living Family/patient expects to be discharged to:: Private residence Living Arrangements: Alone Available Help at Discharge: Friend(s);Available PRN/intermittently Type of Home: Apartment Home Access: Level entry     Home Layout: One level Home Equipment: None      Prior Function Level of Independence: Independent               Hand Dominance        Extremity/Trunk Assessment   Upper Extremity Assessment Upper Extremity Assessment: Overall WFL for tasks assessed    Lower Extremity Assessment Lower Extremity Assessment: Generalized weakness;LLE deficits/detail LLE Deficits / Details: pt reports pain in L ankle and foot with ambulation LLE: Unable to fully assess due to pain       Communication   Communication: No difficulties  Cognition Arousal/Alertness: Awake/alert Behavior During Therapy: WFL for tasks assessed/performed  Overall Cognitive Status: History of cognitive impairments - at baseline                                 General Comments: pt with dementia at baseline      General Comments      Exercises     Assessment/Plan    PT Assessment Patient needs continued PT services  PT Problem List Decreased activity tolerance;Decreased balance;Decreased  mobility;Decreased coordination;Decreased knowledge of use of DME;Decreased safety awareness;Pain       PT Treatment Interventions DME instruction;Gait training;Stair training;Functional mobility training;Therapeutic activities;Balance training;Therapeutic exercise;Neuromuscular re-education;Patient/family education    PT Goals (Current goals can be found in the Care Plan section)  Acute Rehab PT Goals Patient Stated Goal: return home PT Goal Formulation: With patient Time For Goal Achievement: 03/27/17 Potential to Achieve Goals: Good    Frequency Min 3X/week   Barriers to discharge        Co-evaluation               AM-PAC PT "6 Clicks" Daily Activity  Outcome Measure Difficulty turning over in bed (including adjusting bedclothes, sheets and blankets)?: None Difficulty moving from lying on back to sitting on the side of the bed? : None Difficulty sitting down on and standing up from a chair with arms (e.g., wheelchair, bedside commode, etc,.)?: None Help needed moving to and from a bed to chair (including a wheelchair)?: A Little Help needed walking in hospital room?: A Little Help needed climbing 3-5 steps with a railing? : A Little 6 Click Score: 21    End of Session Equipment Utilized During Treatment: Gait belt Activity Tolerance: Patient tolerated treatment well Patient left: in bed;with call bell/phone within reach;Other (comment) (MD in room assessing pt sitting EOB) Nurse Communication: Mobility status PT Visit Diagnosis: Other abnormalities of gait and mobility (R26.89);Pain Pain - Right/Left: Left Pain - part of body: Ankle and joints of foot    Time: 1203-1214 PT Time Calculation (min) (ACUTE ONLY): 11 min   Charges:   PT Evaluation $PT Eval Moderate Complexity: 1 Mod     PT G Codes:        [place], PT, DPT [place] 03/13/2017, 2:02 PM

## 2017-03-13 NOTE — Progress Notes (Signed)
Progress Note  Patient Name: Timothy Zimmerman Date of Encounter: 03/13/2017  Primary Cardiologist: Dr. Martinique   Subjective   Feeling well. No chest pain, sob or palpitations.   Inpatient Medications    Scheduled Meds: . aspirin  81 mg Oral Daily  . atorvastatin  80 mg Oral q1800  . clopidogrel  75 mg Oral Q breakfast  . heparin  5,000 Units Subcutaneous Q8H  . metoprolol tartrate  50 mg Oral BID  . ramipril  5 mg Oral BID  . sodium chloride flush  3 mL Intravenous Q12H   Continuous Infusions: . sodium chloride     PRN Meds: sodium chloride, acetaminophen, ALPRAZolam, nitroGLYCERIN, ondansetron (ZOFRAN) IV, oxyCODONE-acetaminophen, zolpidem   Vital Signs    Vitals:   03/12/17 1200 03/12/17 1311 03/12/17 2118 03/13/17 0544  BP: (!) 139/93 (!) 141/98 (!) 158/82   Pulse: 64 68 67   Resp: 17 19 20    Temp:  99.3 F (37.4 C) 98.2 F (36.8 C)   TempSrc:  Oral Oral   SpO2: 98% 99% 100%   Weight:  146 lb 6.2 oz (66.4 kg)  145 lb 1.6 oz (65.8 kg)  Height:  6' (1.829 m)      Intake/Output Summary (Last 24 hours) at 03/13/17 1040 Last data filed at 03/13/17 0909  Gross per 24 hour  Intake              720 ml  Output              400 ml  Net              320 ml   Filed Weights   03/12/17 0400 03/12/17 1311 03/13/17 0544  Weight: 145 lb 15.1 oz (66.2 kg) 146 lb 6.2 oz (66.4 kg) 145 lb 1.6 oz (65.8 kg)    Telemetry    NSR - Personally Reviewed  ECG    None today   Physical Exam   GEN: Thin AA male in no acute distress.   Neck: No JVD Cardiac: RRR, no murmurs, rubs, or gallops.  Respiratory: Clear to auscultation bilaterally. GI: Soft, nontender, non-distended  MS: No edema; No deformity. Neuro:  Nonfocal  Psych: Normal affect   Labs    Chemistry Recent Labs Lab 03/11/17 1051 03/12/17 0536  NA 139 140  K 3.8 4.0  CL 107 106  CO2 24 24  GLUCOSE 102* 85  BUN 21* 16  CREATININE 1.31* 1.12  CALCIUM 9.4 8.9  PROT  --  7.0  ALBUMIN  --  3.0*  AST   --  107*  ALT  --  82*  ALKPHOS  --  71  BILITOT  --  0.8  GFRNONAA 54* >60  GFRAA >60 >60  ANIONGAP 8 10     Hematology Recent Labs Lab 03/11/17 1051 03/12/17 0536  WBC 4.0 4.5  RBC 4.30 4.33  HGB 13.1 13.1  HCT 38.3* 38.8*  MCV 89.1 89.6  MCH 30.5 30.3  MCHC 34.2 33.8  RDW 14.6 14.9  PLT 184 175    Cardiac Enzymes Recent Labs Lab 03/11/17 2001 03/11/17 2346 03/12/17 0536  TROPONINI 12.49* 14.31* 12.90*    Recent Labs Lab 03/11/17 1058  TROPIPOC 7.51*      Radiology    Dg Chest Portable 1 View  Result Date: 03/11/2017 CLINICAL DATA:  Chest pain. Pre cardiac catheterization. prior myocardial infarction. EXAM: PORTABLE CHEST 1 VIEW COMPARISON:  08/12/2015 FINDINGS: Prior median sternotomy. Numerous leads and wires project  over the chest. Midline trachea. Normal heart size for level of inspiration. No pleural effusion or pneumothorax. Subtle interstitial prominence and patchy opacities at the bases. Mild right hemidiaphragm elevation. Convex right thoracic spine curvature. IMPRESSION: Suspicion of mild pulmonary venous congestion. Electronically Signed   By: Abigail Miyamoto M.D.   On: 03/11/2017 11:36    Cardiac Studies   Procedures   CORONARY STENT INTERVENTION  LEFT HEART CATH AND CORONARY ANGIOGRAPHY  Conclusion    Non-ST elevation myocardial infarction probably urgently to the cath lab because of ST-T wave abnormality involving the anterior wall and difficult to obtain history that suggested he was having ongoing chest pain.  Bypass graft failure with occlusion of the SVG to PDA, total occlusion of the SVG to the circumflex, and atresia of the left internal mammary graft to the LAD.  Patent SVG to the second diagonal which filled the LAD proximally revealing 99% stenosis beyond the origin of the diagonal in the proximal LAD.  Widely patent native right coronary with moderate 60-70% proximal narrowing.  Widely patent native circumflex, with  irregularities noted in the mid vessel.  95-99% stenosis in the proximal to mid LAD just beyond the origin of the second diagonal. LAD was the culprit lesion for the presentation.  Successful PCI via the native LAD reducing the 99% stenosis to less than 10% with TIMI grade 3 flow using a Promus Premier 3.5 x 20 mm DES postdilated to 4 mm in diameter.  RECOMMENDATIONS:   Aspirin and Plavix for at least 1 year  Aggressive risk factor modification  Social services/care management to assist with medications and living conditions. No family available to communicate with following the procedure.  Discontinued bivalirudin upon completion of the currently infusing back.    Pending echo  Patient Profile     66 y.o. male 66 y.o. male with history of CAD s/p CABG in 2009, HTN and non compliance presents with NSTEMI.   Assessment & Plan    1. NSTEMI - Peak of troponin 14.31 then trended down. Ecg shows improvement in anterior ST-T changes. Cath as above. Bypass graft failure with occlusion of the SVG to PDA, total occlusion of the SVG to the circumflex, and atresia of LIMA to the LAD. Only patent SVT to diagonal. 99% pLAD s/p DES. Medical management of 60-70%pRCA. Chest pain resolved.  - Ambulated well with cardiac rehab but noted balance issue. Will get PT Eval and treat. Pending echocardiogram.  - Continue ASA, plavix, Statin, BB and ACE.   2. HTN - Still elevated. Consider up tiration of BB or ACE.   3. HLd -continue high dose statin. Check lipid panel in AM.   4. Tobacco abuse - Cessation advised  5. Non compliance - Address importance of medication compliance and routine follow up. Seems interested.   Signed, Leanor Kail, PA  03/13/2017, 10:40 AM    Patient seen and examined and history reviewed. Agree with above findings and plan. No chest pain or dyspnea. Does complain of left foot pain with ambulation. Pulses very poor. Will check LE arterial dopplers. Echo is  pending.  BP still elevated. Will add amlodipine 5 mg daily to ACEi and metoprolol.   Peter Martinique, [place] 03/13/2017 12:18 PM

## 2017-03-13 NOTE — Progress Notes (Signed)
Patient is alert, oriented and very cooperative. He has been up with physical therapy today and tolerated the activity well. Patient is up to date and agreeable to care plan with the possibility of discharge tomorrow.

## 2017-03-14 ENCOUNTER — Telehealth: Payer: Self-pay | Admitting: Physician Assistant

## 2017-03-14 ENCOUNTER — Inpatient Hospital Stay (HOSPITAL_COMMUNITY): Payer: Medicare Other

## 2017-03-14 DIAGNOSIS — I70219 Atherosclerosis of native arteries of extremities with intermittent claudication, unspecified extremity: Secondary | ICD-10-CM

## 2017-03-14 MED ORDER — ATORVASTATIN CALCIUM 80 MG PO TABS: 80.0000 mg | ORAL_TABLET | Freq: Every day | ORAL | 1 refills | Status: DC

## 2017-03-14 MED ORDER — RAMIPRIL 5 MG PO CAPS: 5.0000 mg | ORAL_CAPSULE | Freq: Two times a day (BID) | ORAL | 6 refills | Status: AC

## 2017-03-14 MED ORDER — ASPIRIN 81 MG PO CHEW: 81.0000 mg | CHEWABLE_TABLET | Freq: Every day | ORAL | 11 refills | Status: AC

## 2017-03-14 MED ORDER — CLOPIDOGREL BISULFATE 75 MG PO TABS: 75.0000 mg | ORAL_TABLET | Freq: Every day | ORAL | 11 refills | Status: DC

## 2017-03-14 MED ORDER — AMLODIPINE BESYLATE 5 MG PO TABS: 5.0000 mg | ORAL_TABLET | Freq: Every day | ORAL | 6 refills | Status: AC

## 2017-03-14 MED ORDER — METOPROLOL TARTRATE 50 MG PO TABS: 50.0000 mg | ORAL_TABLET | Freq: Two times a day (BID) | ORAL | 6 refills | Status: AC

## 2017-03-14 NOTE — Telephone Encounter (Signed)
New message      TCM appt on 03-27-17 at 11:30 with Almyra Deforest per Cephus Shelling

## 2017-03-14 NOTE — Progress Notes (Signed)
Progress Note  Patient Name: Timothy Zimmerman Date of Encounter: 03/14/2017  Primary Cardiologist: Dr. Martinique   Subjective   Ambulated well with PT yesterday without chest pain or dyspnea.   Inpatient Medications    Scheduled Meds: . amLODipine  5 mg Oral Daily  . aspirin  81 mg Oral Daily  . atorvastatin  80 mg Oral q1800  . clopidogrel  75 mg Oral Q breakfast  . heparin  5,000 Units Subcutaneous Q8H  . metoprolol tartrate  50 mg Oral BID  . ramipril  5 mg Oral BID  . sodium chloride flush  3 mL Intravenous Q12H   Continuous Infusions: . sodium chloride     PRN Meds: sodium chloride, acetaminophen, ALPRAZolam, nitroGLYCERIN, ondansetron (ZOFRAN) IV, oxyCODONE-acetaminophen, zolpidem   Vital Signs    Vitals:   03/13/17 0544 03/13/17 1235 03/13/17 2128 03/14/17 0450  BP:  (!) 166/75 (!) 155/88 (!) 155/89  Pulse:  68 61 (!) 57  Resp:  20 18 20   Temp:  98.2 F (36.8 C) 98.9 F (37.2 C) 98.5 F (36.9 C)  TempSrc:  Oral Oral Oral  SpO2:  100% 98% 100%  Weight: 145 lb 1.6 oz (65.8 kg)   144 lb 11.2 oz (65.6 kg)  Height:        Intake/Output Summary (Last 24 hours) at 03/14/17 0902 Last data filed at 03/14/17 0847  Gross per 24 hour  Intake              963 ml  Output             1725 ml  Net             -762 ml   Filed Weights   03/12/17 1311 03/13/17 0544 03/14/17 0450  Weight: 146 lb 6.2 oz (66.4 kg) 145 lb 1.6 oz (65.8 kg) 144 lb 11.2 oz (65.6 kg)    Telemetry    NSR - Personally Reviewed  ECG  None today   Physical Exam   GEN: No acute distress.   Neck: No JVD Cardiac: RRR, no murmurs, rubs, or gallops.  Weak LE pulse bilaterally.  Respiratory: Clear to auscultation bilaterally. GI: Soft, nontender, non-distended  MS: No edema; No deformity. Neuro:  Nonfocal  Psych: Normal affect   Labs    Chemistry Recent Labs Lab 03/11/17 1051 03/12/17 0536  NA 139 140  K 3.8 4.0  CL 107 106  CO2 24 24  GLUCOSE 102* 85  BUN 21* 16  CREATININE  1.31* 1.12  CALCIUM 9.4 8.9  PROT  --  7.0  ALBUMIN  --  3.0*  AST  --  107*  ALT  --  82*  ALKPHOS  --  71  BILITOT  --  0.8  GFRNONAA 54* >60  GFRAA >60 >60  ANIONGAP 8 10     Hematology Recent Labs Lab 03/11/17 1051 03/12/17 0536  WBC 4.0 4.5  RBC 4.30 4.33  HGB 13.1 13.1  HCT 38.3* 38.8*  MCV 89.1 89.6  MCH 30.5 30.3  MCHC 34.2 33.8  RDW 14.6 14.9  PLT 184 175    Cardiac Enzymes Recent Labs Lab 03/11/17 2001 03/11/17 2346 03/12/17 0536  TROPONINI 12.49* 14.31* 12.90*    Recent Labs Lab 03/11/17 1058  TROPIPOC 7.51*     BNPNo results for input(s): BNP, PROBNP in the last 168 hours.   DDimer No results for input(s): DDIMER in the last 168 hours.   Radiology    No results found.  Cardiac Studies   Procedures   CORONARY STENT INTERVENTION  LEFT HEART CATH AND CORONARY ANGIOGRAPHY  Conclusion    Non-ST elevation myocardial infarction probably urgently to the cath lab because of ST-T wave abnormality involving the anterior wall and difficult to obtain history that suggested he was having ongoing chest pain.  Bypass graft failure with occlusion of the SVG to PDA, total occlusion of the SVG to the circumflex, and atresia of the left internal mammary graft to the LAD.  Patent SVG to the second diagonal which filled the LAD proximally revealing 99% stenosis beyond the origin of the diagonal in the proximal LAD.  Widely patent native right coronary with moderate 60-70% proximal narrowing.  Widely patent native circumflex, with irregularities noted in the mid vessel.  95-99% stenosis in the proximal to mid LAD just beyond the origin of the second diagonal. LAD was the culprit lesion for the presentation.  Successful PCI via the native LAD reducing the 99% stenosis to less than 10% with TIMI grade 3 flow using a Promus Premier 3.5 x 20 mm DES postdilated to 4 mm in diameter.  RECOMMENDATIONS:   Aspirin and Plavix for at least 1  year  Aggressive risk factor modification  Social services/care management to assist with medications and living conditions. No family available to communicate with following the procedure.  Discontinued bivalirudin upon completion of the currently infusing back.   Echo 03/13/17 Study Conclusions  - Left ventricle: The cavity size was normal. There was severe   focal basal hypertrophy. Systolic function was mildly to   moderately reduced. The estimated ejection fraction was in the   range of 40% to 45%. There is akinesis of the apical septal   myocardium. There is akinesis of the apicalanterior, inferior,   and apical myocardium. There is akinesis of the midanteroseptal   myocardium. Features are consistent with a pseudonormal left   ventricular filling pattern, with concomitant abnormal relaxation   and increased filling pressure (grade 2 diastolic dysfunction).   There was mild spontaneous echo contrast, indicative of stasis. - Aortic valve: Trileaflet; moderately thickened, mildly calcified   leaflets. - Mitral valve: There was mild regurgitation. - Left atrium: The atrium was mildly dilated. - Pulmonic valve: There was trivial regurgitation. - Pulmonary arteries: PA peak pressure: 36 mm Hg (S).  Impressions:  - The right ventricular systolic pressure was increased consistent   with mild pulmonary hypertension.   Patient Profile     66 y.o. male 66 y.o.malewith history of CAD s/p CABG in 2009, HTN and non compliance presents with NSTEMI.   Assessment & Plan    1. NSTEMI - Peak of troponin 14.31 then trended down. Ecg shows improvement in anterior ST-T changes. Cath as above. Bypass graft failure with occlusion of the SVG to PDA, total occlusion of the SVG to the circumflex, and atresia of LIMA to the LAD. Only patent SVT to diagonal. 99% pLAD s/p DES. Medical management of 60-70%pRCA. Chest pain resolved.  - Ambulating without angina.  - Continue ASA, plavix, BB  and ACE.   2. Chronic combined CHF - Echo showed LVEF of 40-45% with grade 2 DD. The right ventricular systolic pressure was increased consistent   with mild pulmonary hypertension. - Net I & O negative 2.2 L. Weight down 3 lb (147-->144). Volume status stable.  - Continue ASA and ACE.   3. HTN - BP still minimally elevated on  BB, ACE and addition of Amlodipine.  4. Transaminitis - Liver function  elevated  (noted since 08/2015) however higher then before. ? Discontinue Lipitor. Will review with MD.   5. HLD - Will need lipid panel as outpatient. Statin as above.   6.  Tobacco abuse - Cessation advised  7. Foot pain - weak pulse. Pending LE arterial doppler with ABI. Spoke with echo department. Will get to done soon with anticipated discharge.   8. Non compliance - Address importance of medication compliance and routine follow up. Seems interested.   Signed, Leanor Kail, PA  03/14/2017, 9:02 AM    Patient seen and examined and history reviewed. Agree with above findings and plan. Ambulating with cardiac Rehab. Feels well. Foot pain better. LE arterial dopplers pending. Stressed importance of taking his medication especially DAPT. Stressed importance of follow up in our office. Will plan on DC home today.  Yug Loria Martinique, [place] 03/14/2017 9:30 AM

## 2017-03-14 NOTE — Progress Notes (Signed)
CARDIAC REHAB PHASE I   PRE:  Rate/Rhythm: 68 SR    BP: sitting 161/111    SaO2:   MODE:  Ambulation: 470 ft   POST:  Rate/Rhythm: 73 SR    BP: sitting 179/112     SaO2: 100 RA  Pt excited to use rollator. Steady, no c/o with slow pace. Gets distracted at times. BP elevated. Reminded pt of Plavix. He could not perform teach back. He is wanting a cigarette. Gave him another fake cigarette. Pt is ready for d/c. Would benefit from rollator. He is also requesting a taxi. [place], ACSM 03/14/2017 9:47 AM

## 2017-03-14 NOTE — Discharge Summary (Signed)
Discharge Summary    Patient ID: Timothy Zimmerman,  MRN: [id number], DOB/AGE: 1950-10-19 66 y.o.  Admit date: 03/11/2017 Discharge date: 03/14/2017  Primary Care Provider: Cathlean Cower Primary Cardiologist: Dr. Martinique   Discharge Diagnoses    Principal Problem:   Non-ST elevation (NSTEMI) myocardial infarction [facility]) Active Problems:   Coronary atherosclerosis of native coronary artery   Uncontrolled hypertension   HTN, goal below 130/80   Hyperlipidemia LDL goal <70    Transaminitis   Left leg pain  Allergies No Known Allergies  Diagnostic Studies/Procedures    Procedures   CORONARY STENT INTERVENTION  LEFT HEART CATH AND CORONARY ANGIOGRAPHY  Conclusion    Non-ST elevation myocardial infarction probably urgently to the cath lab because of ST-T wave abnormality involving the anterior wall and difficult to obtain history that suggested he was having ongoing chest pain.  Bypass graft failure with occlusion of the SVG to PDA, total occlusion of the SVG to the circumflex, and atresia of the left internal mammary graft to the LAD.  Patent SVG to the second diagonal which filled the LAD proximally revealing 99% stenosis beyond the origin of the diagonal in the proximal LAD.  Widely patent native right coronary with moderate 60-70% proximal narrowing.  Widely patent native circumflex, with irregularities noted in the mid vessel.  95-99% stenosis in the proximal to mid LAD just beyond the origin of the second diagonal. LAD was the culprit lesion for the presentation.  Successful PCI via the native LAD reducing the 99% stenosis to less than 10% with TIMI grade 3 flow using a Promus Premier 3.5 x 20 mm DES postdilated to 4 mm in diameter.  RECOMMENDATIONS:   Aspirin and Plavix for at least 1 year  Aggressive risk factor modification  Social services/care management to assist with medications and living conditions. No family available to communicate with following  the procedure.  Discontinued bivalirudin upon completion of the currently infusing back.   Echo 03/13/17 Study Conclusions  - Left ventricle: The cavity size was normal. There was severe focal basal hypertrophy. Systolic function was mildly to moderately reduced. The estimated ejection fraction was in the range of 40% to 45%. There is akinesis of the apical septal myocardium. There is akinesis of the apicalanterior, inferior, and apical myocardium. There is akinesis of the midanteroseptal myocardium. Features are consistent with a pseudonormal left ventricular filling pattern, with concomitant abnormal relaxation and increased filling pressure (grade 2 diastolic dysfunction). There was mild spontaneous echo contrast, indicative of stasis. - Aortic valve: Trileaflet; moderately thickened, mildly calcified leaflets. - Mitral valve: There was mild regurgitation. - Left atrium: The atrium was mildly dilated. - Pulmonic valve: There was trivial regurgitation. - Pulmonary arteries: PA peak pressure: 36 mm Hg (S).  Impressions:  - The right ventricular systolic pressure was increased consistent with mild pulmonary hypertension.    History of Present Illness     66 y.o.male66 y.o.malewith history of CAD s/p CABG in 2009, HTN and non compliancepresents with NSTEMI.   Hospital Course     Consultants: None  1. NSTEMI - Peak of troponin 14.31 then trended down. Ecg shows improvement in anterior ST-T changes. Cath as above. Bypass graft failure with occlusion of the SVG to PDA, total occlusion of the SVG to the circumflex, and atresia of LIMA to the LAD. Only patent SVT to diagonal. 99% pLAD s/p DES. Medical management of 60-70%pRCA. Chest pain resolved. Ambulated without angina. Continue ASA, plavix, BB and ACE.   2.  Chronic combined CHF - Echo showed LVEF of 40-45% with grade 2 DD. The right ventricular systolic pressure was increased consistent with  mild pulmonary hypertension. Net I & O negative 2.2 L. Weight down 3 lb (147-->144). Volume status stable.  - Continue BB and ACE.   3. HTN - BP still minimally elevated on  BB, ACE and addition of Amlodipine. Follow as outpatient.   4. Transaminitis - Liver function elevated  (noted since 08/2015) however higher then before. Per Dr. Martinique, will continue Lipitor. Likely due to alcohol abuse with low albumin. Repeat labs during follow up. Advised cessation.    5. HLD - Will need lipid panel as outpatient. Statin as above.   6.  Tobacco abuse - Cessation advised  7. Leg pain with ambulation - weak pulse. LE arterial doppler with ABI will with done prior to discharge. Discuss result during follow up.    8. Non compliance - Address importance of medication compliance and routine follow up. Seems interested.   The patient has been seen by Dr.Jordan today and deemed ready for discharge home. All follow-up appointments have been scheduled. Discharge medications are listed below.  _____________   Discharge Vitals Blood pressure (!) 155/89, pulse (!) 57, temperature 98.5 F (36.9 C), temperature source Oral, resp. rate 20, height 6' (1.829 m), weight 144 lb 11.2 oz (65.6 kg), SpO2 100 %.  Filed Weights   03/12/17 1311 03/13/17 0544 03/14/17 0450  Weight: 146 lb 6.2 oz (66.4 kg) 145 lb 1.6 oz (65.8 kg) 144 lb 11.2 oz (65.6 kg)    Labs & Radiologic Studies     CBC  Recent Labs  03/11/17 1051 03/12/17 0536  WBC 4.0 4.5  HGB 13.1 13.1  HCT 38.3* 38.8*  MCV 89.1 89.6  PLT 184 295   Basic Metabolic Panel  Recent Labs  03/11/17 1051 03/12/17 0536  NA 139 140  K 3.8 4.0  CL 107 106  CO2 24 24  GLUCOSE 102* 85  BUN 21* 16  CREATININE 1.31* 1.12  CALCIUM 9.4 8.9   Liver Function Tests  Recent Labs  03/12/17 0536  AST 107*  ALT 82*  ALKPHOS 71  BILITOT 0.8  PROT 7.0  ALBUMIN 3.0*   No results for input(s): LIPASE, AMYLASE in the last 72 hours. Cardiac  Enzymes  Recent Labs  03/11/17 2001 03/11/17 2346 03/12/17 0536  TROPONINI 12.49* 14.31* 12.90*   Hemoglobin A1C  Recent Labs  03/11/17 2001  HGBA1C 5.5   Thyroid Function Tests  Recent Labs  03/11/17 2001  TSH 1.135    Dg Chest Portable 1 View  Result Date: 03/11/2017 CLINICAL DATA:  Chest pain. Pre cardiac catheterization. prior myocardial infarction. EXAM: PORTABLE CHEST 1 VIEW COMPARISON:  08/12/2015 FINDINGS: Prior median sternotomy. Numerous leads and wires project over the chest. Midline trachea. Normal heart size for level of inspiration. No pleural effusion or pneumothorax. Subtle interstitial prominence and patchy opacities at the bases. Mild right hemidiaphragm elevation. Convex right thoracic spine curvature. IMPRESSION: Suspicion of mild pulmonary venous congestion. Electronically Signed   By: Abigail Miyamoto M.D.   On: 03/11/2017 11:36    Disposition   Pt is being discharged home today in good condition.  Follow-up Plans & Appointments    Follow-up Information    Almyra Deforest, [place]. Go on 03/27/2017.   Specialties:  Cardiology, Radiology Why:  @11 :30am for TCM Contact information: [address] [place] [address] [phone]  Discharge Instructions    Amb Referral to Cardiac Rehabilitation    Complete by:  As directed    Diagnosis:   PTCA STEMI Coronary Stents     Diet - low sodium heart healthy    Complete by:  As directed    Discharge instructions    Complete by:  As directed    NO HEAVY LIFTING (>10lbs) X 2 WEEKS. NO SEXUAL ACTIVITY X 2 WEEKS. NO DRIVING X 1 WEEK. NO SOAKING BATHS, HOT TUBS, POOLS, ETC., X 7 DAYS.   Increase activity slowly    Complete by:  As directed       Discharge Medications   Current Discharge Medication List    START taking these medications   Details  amLODipine (NORVASC) 5 MG tablet Take 1 tablet (5 mg total) by mouth daily. Qty: 30 tablet, Refills: 6    aspirin 81 MG chewable  tablet Chew 1 tablet (81 mg total) by mouth daily. Qty: 30 tablet, Refills: 11    atorvastatin (LIPITOR) 80 MG tablet Take 1 tablet (80 mg total) by mouth daily at 6 PM. Qty: 30 tablet, Refills: 1    clopidogrel (PLAVIX) 75 MG tablet Take 1 tablet (75 mg total) by mouth daily with breakfast. Qty: 30 tablet, Refills: 11    metoprolol tartrate (LOPRESSOR) 50 MG tablet Take 1 tablet (50 mg total) by mouth 2 (two) times daily. Qty: 60 tablet, Refills: 6      CONTINUE these medications which have CHANGED   Details  ramipril (ALTACE) 5 MG capsule Take 1 capsule (5 mg total) by mouth 2 (two) times daily. Qty: 60 capsule, Refills: 6      CONTINUE these medications which have NOT CHANGED   Details  nitroGLYCERIN (NITROSTAT) 0.4 MG SL tablet Place 0.4 mg under the tongue every 5 (five) minutes as needed.        STOP taking these medications     cloNIDine (CATAPRES) 0.2 MG tablet      meloxicam (MOBIC) 7.5 MG tablet      terazosin (HYTRIN) 2 MG capsule      triamterene-hydrochlorothiazide (MAXZIDE-25) 37.5-25 MG tablet          Aspirin prescribed at discharge?  Yes High Intensity Statin Prescribed? (Lipitor 40-80mg  or Crestor 20-40mg ): Yes Beta Blocker Prescribed? Yes For EF 45% or less, Was ACEI/ARB Prescribed? Yes ADP Receptor Inhibitor Prescribed? (i.e. Plavix etc.-Includes Medically Managed Patients): Yes For EF <40%, Aldosterone Inhibitor Prescribed? N/A Was EF assessed during THIS hospitalization? Yes Was Cardiac Rehab II ordered? (Included Medically managed Patients): Yes   Outstanding Labs/Studies   LFT in Lipid panel during TCM follow up  Duration of Discharge Encounter   Greater than 30 minutes including physician time.  Signed, Darcy Barbara PA-C 03/14/2017, 10:50 AM

## 2017-03-14 NOTE — Progress Notes (Signed)
VASCULAR LAB PRELIMINARY  ARTERIAL  ABI completed:    RIGHT    LEFT    PRESSURE WAVEFORM  PRESSURE WAVEFORM  BRACHIAL 169 Triphasic BRACHIAL 164 Triphasic  DP 199 Biphasic DP 169 Monophasic  PT 213 Monophasic PT 134 Monophasic  GREAT TOE  NA GREAT TOE 176 NA    RIGHT LEFT  ABI / TBI 1.26 / 0.56 1.0 / 1.04   Bilateral ABIs indicate normal arterial flow at rest however Doppler waveforms would suggest a possible elevation of pressures.  TBIs on the right are abnormal. TBIs on the left appear normal. Once again Doppler waveforms  May suggest a false elevation of pressures.  [place], RVS 03/14/2017, 2:06 PM

## 2017-03-14 NOTE — Care Management Note (Addendum)
Case Management Note  Patient Details  Name: JOHNROBERT FOTI MRN: [id number] Date of Birth: [birth year]  Subjective/Objective:                    Action/Plan:  Rollator to be delivered to patient's room prior to discharge. Expected Discharge Date:  03/14/17               Expected Discharge Plan:  [place]  In-House Referral:     Discharge planning Services  CM Consult  Post Acute Care Choice:  Durable Medical Equipment, Home Health Choice offered to:  Patient  DME Arranged:  [place] rolling DME Agency:  [place]:  PT [facility] Agency:  [facility]  Status of Service:  Completed, signed off  If discussed at [place] of Stay Meetings, dates discussed:    Additional Comments:  Marilu Favre, RN 03/14/2017, 10:59 AM

## 2017-03-15 NOTE — Telephone Encounter (Signed)
New message      Physical therapist want to know if we will give a verbal order for a nurse to come out and go over medications with pt.  He was recently discharged from the hosp and his PCP is not in the office.

## 2017-03-18 NOTE — Telephone Encounter (Signed)
Spoke with PT, he has gotten the patients medications straightened out and he was able to get an order from the PCP for nurse. He reports the patient has no way for [place] to get in contact with him. Number in chart rings busy.

## 2017-03-19 ENCOUNTER — Telehealth (HOSPITAL_COMMUNITY): Payer: Self-pay

## 2017-03-19 NOTE — Telephone Encounter (Signed)
Patient insurances are active and benefits verified. Patient has Medicare A/B and Medicaid.  Medicare A/B - no co-payment, no deductible, no out of pocket, no co-insurance, and no pre-authorization. Passport/reference [phone]. Medicaid - no co-payment, no deductible, no out of pocket, no co-insurance and no pre-authorization. Passport/reference [phone].   Patient will be contacted and scheduled after their follow up appointment with the cardiologist on 03/27/17, upon review by [facility] RN navigator.

## 2017-03-27 ENCOUNTER — Ambulatory Visit: Payer: Medicare Other | Admitting: Physician Assistant
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
## 2017-04-17 ENCOUNTER — Other Ambulatory Visit: Payer: Self-pay | Admitting: Physician Assistant

## 2017-04-17 NOTE — Telephone Encounter (Signed)
Dr Bess Kinds patient.

## 2017-04-18 NOTE — Telephone Encounter (Signed)
Rx(s) sent to pharmacy electronically.  

## 2017-08-13 ENCOUNTER — Emergency Department (HOSPITAL_COMMUNITY): Payer: Medicare Other

## 2017-08-13 ENCOUNTER — Observation Stay (HOSPITAL_COMMUNITY)
Admission: EM | Admit: 2017-08-13 | Discharge: 2017-08-14 | Discharge status: Against Medical Advice | Payer: Medicare Other | Attending: Cardiovascular Disease | Admitting: Cardiovascular Disease

## 2017-08-13 ENCOUNTER — Encounter (HOSPITAL_COMMUNITY): Payer: Self-pay | Admitting: Emergency Medicine

## 2017-08-13 DIAGNOSIS — R072 Precordial pain: Secondary | ICD-10-CM

## 2017-08-13 DIAGNOSIS — I272 Pulmonary hypertension, unspecified: Secondary | ICD-10-CM | POA: Insufficient documentation

## 2017-08-13 DIAGNOSIS — I110 Hypertensive heart disease with heart failure: Secondary | ICD-10-CM | POA: Diagnosis not present

## 2017-08-13 DIAGNOSIS — E7800 Pure hypercholesterolemia, unspecified: Secondary | ICD-10-CM | POA: Diagnosis not present

## 2017-08-13 DIAGNOSIS — Z720 Tobacco use: Secondary | ICD-10-CM

## 2017-08-13 DIAGNOSIS — Z8249 Family history of ischemic heart disease and other diseases of the circulatory system: Secondary | ICD-10-CM | POA: Diagnosis not present

## 2017-08-13 DIAGNOSIS — I519 Heart disease, unspecified: Secondary | ICD-10-CM | POA: Diagnosis not present

## 2017-08-13 DIAGNOSIS — R079 Chest pain, unspecified: Secondary | ICD-10-CM | POA: Diagnosis not present

## 2017-08-13 DIAGNOSIS — Z7982 Long term (current) use of aspirin: Secondary | ICD-10-CM | POA: Diagnosis not present

## 2017-08-13 DIAGNOSIS — Z9119 Patient's noncompliance with other medical treatment and regimen: Secondary | ICD-10-CM | POA: Insufficient documentation

## 2017-08-13 DIAGNOSIS — Z79899 Other long term (current) drug therapy: Secondary | ICD-10-CM | POA: Diagnosis not present

## 2017-08-13 DIAGNOSIS — E785 Hyperlipidemia, unspecified: Secondary | ICD-10-CM | POA: Diagnosis not present

## 2017-08-13 DIAGNOSIS — Z7902 Long term (current) use of antithrombotics/antiplatelets: Secondary | ICD-10-CM | POA: Insufficient documentation

## 2017-08-13 DIAGNOSIS — I252 Old myocardial infarction: Secondary | ICD-10-CM | POA: Diagnosis not present

## 2017-08-13 DIAGNOSIS — D649 Anemia, unspecified: Secondary | ICD-10-CM | POA: Diagnosis not present

## 2017-08-13 DIAGNOSIS — F0390 Unspecified dementia without behavioral disturbance: Secondary | ICD-10-CM | POA: Insufficient documentation

## 2017-08-13 DIAGNOSIS — I2510 Atherosclerotic heart disease of native coronary artery without angina pectoris: Secondary | ICD-10-CM | POA: Diagnosis not present

## 2017-08-13 DIAGNOSIS — I25700 Atherosclerosis of coronary artery bypass graft(s), unspecified, with unstable angina pectoris: Secondary | ICD-10-CM | POA: Diagnosis not present

## 2017-08-13 DIAGNOSIS — F17210 Nicotine dependence, cigarettes, uncomplicated: Secondary | ICD-10-CM | POA: Insufficient documentation

## 2017-08-13 DIAGNOSIS — I2582 Chronic total occlusion of coronary artery: Secondary | ICD-10-CM | POA: Insufficient documentation

## 2017-08-13 DIAGNOSIS — I10 Essential (primary) hypertension: Secondary | ICD-10-CM | POA: Diagnosis not present

## 2017-08-13 DIAGNOSIS — Z955 Presence of coronary angioplasty implant and graft: Secondary | ICD-10-CM | POA: Diagnosis not present

## 2017-08-13 LAB — BASIC METABOLIC PANEL WITH GFR
Anion gap: 6 (ref 5–15)
BUN: 24 mg/dL — ABNORMAL HIGH (ref 6–20)
CO2: 26 mmol/L (ref 22–32)
Calcium: 9 mg/dL (ref 8.9–10.3)
Chloride: 103 mmol/L (ref 101–111)
Creatinine, Ser: 1.36 mg/dL — ABNORMAL HIGH (ref 0.61–1.24)
GFR calc Af Amer: >60 mL/min
GFR calc non Af Amer: 52 mL/min — ABNORMAL LOW
Glucose, Bld: 78 mg/dL (ref 65–99)
Potassium: 5.3 mmol/L — ABNORMAL HIGH (ref 3.5–5.1)
Sodium: 135 mmol/L (ref 135–145)

## 2017-08-13 LAB — CBC
HCT: 38.3 % — ABNORMAL LOW (ref 39.0–52.0)
HEMATOCRIT: 38.9 % — AB (ref 39.0–52.0)
HEMOGLOBIN: 12.6 g/dL — AB (ref 13.0–17.0)
Hemoglobin: 12.7 g/dL — ABNORMAL LOW (ref 13.0–17.0)
MCH: 29.8 pg (ref 26.0–34.0)
MCH: 29.2 pg (ref 26.0–34.0)
MCHC: 33.2 g/dL (ref 30.0–36.0)
MCHC: 32.4 g/dL (ref 30.0–36.0)
MCV: 89.9 fL (ref 78.0–100.0)
MCV: 90 fL (ref 78.0–100.0)
Platelets: 184 K/uL (ref 150–400)
Platelets: 171 10*3/uL (ref 150–400)
RBC: 4.26 MIL/uL (ref 4.22–5.81)
RBC: 4.32 MIL/uL (ref 4.22–5.81)
RDW: 14.6 % (ref 11.5–15.5)
RDW: 14.3 % (ref 11.5–15.5)
WBC: 3.4 K/uL — ABNORMAL LOW (ref 4.0–10.5)
WBC: 3.9 10*3/uL — ABNORMAL LOW (ref 4.0–10.5)

## 2017-08-13 LAB — CREATININE, SERUM
Creatinine, Ser: 1.44 mg/dL — ABNORMAL HIGH (ref 0.61–1.24)
GFR, EST AFRICAN AMERICAN: 56 mL/min — AB (ref >60)
GFR, EST NON AFRICAN AMERICAN: 48 mL/min — AB (ref >60)

## 2017-08-13 LAB — I-STAT TROPONIN, ED: Troponin i, poc: 0 ng/mL (ref 0.00–0.08)

## 2017-08-13 LAB — TROPONIN I: Troponin I: <0.03 ng/mL (ref <0.03)

## 2017-08-13 MED ORDER — ASPIRIN 81 MG PO CHEW: 81.0000 mg | CHEWABLE_TABLET | Freq: Every day | ORAL | Status: DC

## 2017-08-13 MED ORDER — MORPHINE SULFATE (PF) 4 MG/ML IV SOLN: 4.0000 mg | Freq: Once | INTRAVENOUS | Status: DC

## 2017-08-13 MED ORDER — ATORVASTATIN CALCIUM 80 MG PO TABS: 80.0000 mg | ORAL_TABLET | Freq: Every day | ORAL | Status: DC

## 2017-08-13 MED ORDER — NITROGLYCERIN 0.4 MG SL SUBL: 0.4000 mg | SUBLINGUAL_TABLET | SUBLINGUAL | Status: DC | PRN

## 2017-08-13 MED ORDER — ONDANSETRON HCL 4 MG/2ML IJ SOLN: 4.0000 mg | Freq: Four times a day (QID) | INTRAMUSCULAR | Status: DC | PRN

## 2017-08-13 MED ORDER — RAMIPRIL 5 MG PO CAPS
5.0000 mg | ORAL_CAPSULE | Freq: Two times a day (BID) | ORAL | Status: DC
  Administered 2017-08-14: 5 mg via ORAL
  Filled 2017-08-13 (×2): qty 1

## 2017-08-13 MED ORDER — ASPIRIN 300 MG RE SUPP: 300.0000 mg | RECTAL | Status: DC

## 2017-08-13 MED ORDER — METOPROLOL TARTRATE 50 MG PO TABS
50.0000 mg | ORAL_TABLET | Freq: Two times a day (BID) | ORAL | Status: DC
  Administered 2017-08-14: 50 mg via ORAL
  Filled 2017-08-13: qty 1

## 2017-08-13 MED ORDER — AMLODIPINE BESYLATE 5 MG PO TABS: 5.0000 mg | ORAL_TABLET | Freq: Every day | ORAL | Status: DC

## 2017-08-13 MED ORDER — ACETAMINOPHEN 325 MG PO TABS: 650.0000 mg | ORAL_TABLET | ORAL | Status: DC | PRN

## 2017-08-13 MED ORDER — CLOPIDOGREL BISULFATE 75 MG PO TABS: 75.0000 mg | ORAL_TABLET | Freq: Every day | ORAL | Status: DC

## 2017-08-13 MED ORDER — ASPIRIN 81 MG PO CHEW: 324.0000 mg | CHEWABLE_TABLET | ORAL | Status: DC

## 2017-08-13 MED ORDER — HEPARIN SODIUM (PORCINE) 5000 UNIT/ML IJ SOLN
5000.0000 [IU] | Freq: Three times a day (TID) | INTRAMUSCULAR | Status: DC
  Administered 2017-08-14: 5000 [IU] via SUBCUTANEOUS
  Filled 2017-08-13: qty 1

## 2017-08-13 MED ORDER — GI COCKTAIL ~~LOC~~: 30.0000 mL | Freq: Once | ORAL | Status: DC
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
## 2017-08-13 NOTE — ED Notes (Signed)
Patient transported to X-ray 

## 2017-08-13 NOTE — ED Triage Notes (Signed)
Pt arrives by [facility] from [facility] with sudden onset of chest pain was given 4 81mg  ASA PTA. Pt has no pain on arrival. Pt has 18G started by ems.

## 2017-08-13 NOTE — ED Notes (Signed)
Pt reports sudden sharp pain in chest while standing speaking with his nurse. Pt reports pain is 6/10 and intermittent. He states that pain was sharp.
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
## 2017-08-13 NOTE — ED Notes (Signed)
Pt. returned from XR. 

## 2017-08-13 NOTE — ED Notes (Signed)
ED Provider at bedside. 

## 2017-08-13 NOTE — ED Notes (Signed)
Pt spoke with MD Schlossman agreeable to stay.

## 2017-08-13 NOTE — ED Notes (Signed)
Pt moving to [place] per Angela Nevin, RN

## 2017-08-14 DIAGNOSIS — R072 Precordial pain: Secondary | ICD-10-CM

## 2017-08-14 DIAGNOSIS — R079 Chest pain, unspecified: Secondary | ICD-10-CM | POA: Diagnosis not present

## 2017-08-14 LAB — LIPID PANEL
CHOL/HDL RATIO: 3.4 ratio
CHOLESTEROL: 123 mg/dL (ref 0–200)
HDL: 36 mg/dL — AB (ref >40)
LDL Cholesterol: 77 mg/dL (ref 0–99)
Triglycerides: 48 mg/dL (ref <150)
VLDL: 10 mg/dL (ref 0–40)

## 2017-08-14 LAB — BASIC METABOLIC PANEL
Anion gap: 7 (ref 5–15)
BUN: 26 mg/dL — AB (ref 6–20)
CALCIUM: 8.9 mg/dL (ref 8.9–10.3)
CO2: 26 mmol/L (ref 22–32)
CREATININE: 1.32 mg/dL — AB (ref 0.61–1.24)
Chloride: 104 mmol/L (ref 101–111)
GFR calc non Af Amer: 54 mL/min — ABNORMAL LOW (ref >60)
GLUCOSE: 81 mg/dL (ref 65–99)
Potassium: 4.1 mmol/L (ref 3.5–5.1)
Sodium: 137 mmol/L (ref 135–145)

## 2017-08-14 LAB — TROPONIN I: Troponin I: <0.03 ng/mL (ref <0.03)

## 2017-08-14 NOTE — ED Notes (Signed)
Pt states, "I am tired of not getting help. I am leaving. I do not need any help." pt encouraged to remain for treatment, pt offered [place] sandwich while waiting for breakfast to arrive, pt declines, pt aware that his MD recommends to be admitted and he is receiving the care he needs, pt adamant about leaving, pt declines assistance to lobby, pt ambulatory and signs AMA forms

## 2017-08-14 NOTE — Progress Notes (Signed)
Pt left AMA prior to my arrival this AM Timothy Zimmerman

## 2017-08-14 NOTE — ED Notes (Signed)
Stanford Breed, MD cards aware pt AMA

## 2017-08-14 NOTE — ED Notes (Signed)
Admitting notifed that pt left ama

## 2017-11-19 ENCOUNTER — Encounter: Payer: Self-pay | Admitting: Specialist

## 2018-09-03 ENCOUNTER — Other Ambulatory Visit: Payer: Self-pay

## 2018-09-03 ENCOUNTER — Emergency Department (HOSPITAL_COMMUNITY)
Admission: EM | Admit: 2018-09-03 | Discharge: 2018-09-03 | Disposition: A | Discharge status: Home / Self Care | Payer: Medicare Other | Attending: Emergency Medicine | Admitting: Emergency Medicine

## 2018-09-03 ENCOUNTER — Encounter (HOSPITAL_COMMUNITY): Payer: Self-pay | Admitting: Emergency Medicine

## 2018-09-03 DIAGNOSIS — S61422A Laceration with foreign body of left hand, initial encounter: Secondary | ICD-10-CM | POA: Insufficient documentation

## 2018-09-03 DIAGNOSIS — Y939 Activity, unspecified: Secondary | ICD-10-CM | POA: Insufficient documentation

## 2018-09-03 DIAGNOSIS — F17210 Nicotine dependence, cigarettes, uncomplicated: Secondary | ICD-10-CM | POA: Insufficient documentation

## 2018-09-03 DIAGNOSIS — Z951 Presence of aortocoronary bypass graft: Secondary | ICD-10-CM | POA: Insufficient documentation

## 2018-09-03 DIAGNOSIS — Y999 Unspecified external cause status: Secondary | ICD-10-CM | POA: Insufficient documentation

## 2018-09-03 DIAGNOSIS — Y929 Unspecified place or not applicable: Secondary | ICD-10-CM | POA: Diagnosis not present

## 2018-09-03 DIAGNOSIS — F0390 Unspecified dementia without behavioral disturbance: Secondary | ICD-10-CM | POA: Diagnosis not present

## 2018-09-03 DIAGNOSIS — W260XXA Contact with knife, initial encounter: Secondary | ICD-10-CM | POA: Insufficient documentation

## 2018-09-03 DIAGNOSIS — I10 Essential (primary) hypertension: Secondary | ICD-10-CM | POA: Diagnosis not present

## 2018-09-03 DIAGNOSIS — S61412A Laceration without foreign body of left hand, initial encounter: Secondary | ICD-10-CM
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
## 2018-09-03 NOTE — Discharge Instructions (Addendum)
The derma clip that was placed to hold your wound together may follow-up in 3 to 4 days.  If it falls off earlier that is okay you do not need to return to the emergency department.  If it stays on longer than a week that you can peel it off yourself.  Please keep the area clean and dry until the wound is healing.
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
## 2019-03-08 ENCOUNTER — Encounter (HOSPITAL_COMMUNITY): Payer: Self-pay | Admitting: Emergency Medicine

## 2019-03-08 ENCOUNTER — Other Ambulatory Visit: Payer: Self-pay

## 2019-03-08 ENCOUNTER — Emergency Department (HOSPITAL_COMMUNITY): Payer: Medicare Other

## 2019-03-08 ENCOUNTER — Inpatient Hospital Stay (HOSPITAL_COMMUNITY)
Admission: EM | Admit: 2019-03-08 | Discharge: 2019-03-18 | DRG: 436 | Disposition: A | Discharge status: Skilled Nursing Facility | Payer: Medicare Other | Attending: Family Medicine | Admitting: Family Medicine

## 2019-03-08 DIAGNOSIS — M79604 Pain in right leg: Secondary | ICD-10-CM | POA: Diagnosis present

## 2019-03-08 DIAGNOSIS — F0280 Dementia in other diseases classified elsewhere without behavioral disturbance: Secondary | ICD-10-CM

## 2019-03-08 DIAGNOSIS — I2510 Atherosclerotic heart disease of native coronary artery without angina pectoris: Secondary | ICD-10-CM | POA: Diagnosis present

## 2019-03-08 DIAGNOSIS — Y92230 Patient room in hospital as the place of occurrence of the external cause: Secondary | ICD-10-CM | POA: Diagnosis present

## 2019-03-08 DIAGNOSIS — I252 Old myocardial infarction: Secondary | ICD-10-CM | POA: Diagnosis not present

## 2019-03-08 DIAGNOSIS — R1010 Upper abdominal pain, unspecified: Secondary | ICD-10-CM | POA: Diagnosis not present

## 2019-03-08 DIAGNOSIS — F17210 Nicotine dependence, cigarettes, uncomplicated: Secondary | ICD-10-CM | POA: Diagnosis present

## 2019-03-08 DIAGNOSIS — Z951 Presence of aortocoronary bypass graft: Secondary | ICD-10-CM | POA: Diagnosis not present

## 2019-03-08 DIAGNOSIS — F0390 Unspecified dementia without behavioral disturbance: Secondary | ICD-10-CM | POA: Diagnosis not present

## 2019-03-08 DIAGNOSIS — Z560 Unemployment, unspecified: Secondary | ICD-10-CM

## 2019-03-08 DIAGNOSIS — Z8249 Family history of ischemic heart disease and other diseases of the circulatory system: Secondary | ICD-10-CM | POA: Diagnosis not present

## 2019-03-08 DIAGNOSIS — E785 Hyperlipidemia, unspecified: Secondary | ICD-10-CM | POA: Diagnosis present

## 2019-03-08 DIAGNOSIS — Z66 Do not resuscitate: Secondary | ICD-10-CM | POA: Diagnosis not present

## 2019-03-08 DIAGNOSIS — R160 Hepatomegaly, not elsewhere classified: Secondary | ICD-10-CM | POA: Diagnosis present

## 2019-03-08 DIAGNOSIS — Z20828 Contact with and (suspected) exposure to other viral communicable diseases: Secondary | ICD-10-CM | POA: Diagnosis present

## 2019-03-08 DIAGNOSIS — Z7902 Long term (current) use of antithrombotics/antiplatelets: Secondary | ICD-10-CM

## 2019-03-08 DIAGNOSIS — Z91128 Patient's intentional underdosing of medication regimen for other reason: Secondary | ICD-10-CM | POA: Diagnosis not present

## 2019-03-08 DIAGNOSIS — C220 Liver cell carcinoma: Principal | ICD-10-CM | POA: Diagnosis present

## 2019-03-08 DIAGNOSIS — Z515 Encounter for palliative care: Secondary | ICD-10-CM | POA: Diagnosis not present

## 2019-03-08 DIAGNOSIS — R7989 Other specified abnormal findings of blood chemistry: Secondary | ICD-10-CM

## 2019-03-08 DIAGNOSIS — R1031 Right lower quadrant pain: Secondary | ICD-10-CM

## 2019-03-08 DIAGNOSIS — Z79899 Other long term (current) drug therapy: Secondary | ICD-10-CM | POA: Diagnosis not present

## 2019-03-08 DIAGNOSIS — C779 Secondary and unspecified malignant neoplasm of lymph node, unspecified: Secondary | ICD-10-CM | POA: Diagnosis present

## 2019-03-08 DIAGNOSIS — Z7982 Long term (current) use of aspirin: Secondary | ICD-10-CM | POA: Diagnosis not present

## 2019-03-08 DIAGNOSIS — Z9114 Patient's other noncompliance with medication regimen: Secondary | ICD-10-CM

## 2019-03-08 DIAGNOSIS — Z008 Encounter for other general examination: Secondary | ICD-10-CM | POA: Diagnosis not present

## 2019-03-08 DIAGNOSIS — I10 Essential (primary) hypertension: Secondary | ICD-10-CM | POA: Diagnosis present

## 2019-03-08 DIAGNOSIS — Z955 Presence of coronary angioplasty implant and graft: Secondary | ICD-10-CM | POA: Diagnosis not present

## 2019-03-08 DIAGNOSIS — T45526A Underdosing of antithrombotic drugs, initial encounter: Secondary | ICD-10-CM | POA: Diagnosis not present

## 2019-03-08 DIAGNOSIS — C772 Secondary and unspecified malignant neoplasm of intra-abdominal lymph nodes: Secondary | ICD-10-CM | POA: Diagnosis not present

## 2019-03-08 DIAGNOSIS — G893 Neoplasm related pain (acute) (chronic): Secondary | ICD-10-CM | POA: Diagnosis not present

## 2019-03-08 DIAGNOSIS — Z590 Homelessness: Secondary | ICD-10-CM | POA: Diagnosis not present

## 2019-03-08 DIAGNOSIS — F0391 Unspecified dementia with behavioral disturbance: Secondary | ICD-10-CM | POA: Diagnosis present

## 2019-03-08 DIAGNOSIS — R109 Unspecified abdominal pain: Secondary | ICD-10-CM

## 2019-03-08 DIAGNOSIS — F03918 Unspecified dementia, unspecified severity, with other behavioral disturbance: Secondary | ICD-10-CM | POA: Diagnosis present

## 2019-03-08 LAB — URINALYSIS, ROUTINE W REFLEX MICROSCOPIC
Bilirubin Urine: NEGATIVE
Glucose, UA: NEGATIVE mg/dL
Hgb urine dipstick: NEGATIVE
Ketones, ur: NEGATIVE mg/dL
Leukocytes,Ua: NEGATIVE
Nitrite: NEGATIVE
Protein, ur: NEGATIVE mg/dL
Specific Gravity, Urine: 1.015 (ref 1.005–1.030)
pH: 6 (ref 5.0–8.0)

## 2019-03-08 LAB — CBC WITH DIFFERENTIAL/PLATELET
Abs Immature Granulocytes: 0.02 10*3/uL (ref 0.00–0.07)
Basophils Absolute: 0 10*3/uL (ref 0.0–0.1)
Basophils Relative: 1 %
Eosinophils Absolute: 0.1 10*3/uL (ref 0.0–0.5)
Eosinophils Relative: 2 %
HCT: 36.8 % — ABNORMAL LOW (ref 39.0–52.0)
Hemoglobin: 11.9 g/dL — ABNORMAL LOW (ref 13.0–17.0)
Immature Granulocytes: 1 %
Lymphocytes Relative: 41 %
Lymphs Abs: 1.6 10*3/uL (ref 0.7–4.0)
MCH: 29.4 pg (ref 26.0–34.0)
MCHC: 32.3 g/dL (ref 30.0–36.0)
MCV: 90.9 fL (ref 80.0–100.0)
Monocytes Absolute: 0.5 10*3/uL (ref 0.1–1.0)
Monocytes Relative: 12 %
Neutro Abs: 1.7 10*3/uL (ref 1.7–7.7)
Neutrophils Relative %: 43 %
Platelets: 245 10*3/uL (ref 150–400)
RBC: 4.05 MIL/uL — ABNORMAL LOW (ref 4.22–5.81)
RDW: 16.9 % — ABNORMAL HIGH (ref 11.5–15.5)
WBC: 3.8 10*3/uL — ABNORMAL LOW (ref 4.0–10.5)
nRBC: 0 % (ref 0.0–0.2)

## 2019-03-08 LAB — COMPREHENSIVE METABOLIC PANEL
ALT: 72 U/L — ABNORMAL HIGH (ref 0–44)
AST: 73 U/L — ABNORMAL HIGH (ref 15–41)
Albumin: 2.7 g/dL — ABNORMAL LOW (ref 3.5–5.0)
Alkaline Phosphatase: 139 U/L — ABNORMAL HIGH (ref 38–126)
Anion gap: 11 (ref 5–15)
BUN: 15 mg/dL (ref 8–23)
CO2: 24 mmol/L (ref 22–32)
Calcium: 8.9 mg/dL (ref 8.9–10.3)
Chloride: 104 mmol/L (ref 98–111)
Creatinine, Ser: 1 mg/dL (ref 0.61–1.24)
GFR calc Af Amer: >60 mL/min (ref >60)
GFR calc non Af Amer: >60 mL/min (ref >60)
Glucose, Bld: 74 mg/dL (ref 70–99)
Potassium: 3.9 mmol/L (ref 3.5–5.1)
Sodium: 139 mmol/L (ref 135–145)
Total Bilirubin: 0.8 mg/dL (ref 0.3–1.2)
Total Protein: 7.2 g/dL (ref 6.5–8.1)

## 2019-03-08 LAB — CK: Total CK: 223 U/L (ref 49–397)

## 2019-03-08 LAB — TROPONIN I (HIGH SENSITIVITY)
Troponin I (High Sensitivity): 12 ng/L (ref <18)
Troponin I (High Sensitivity): 10 ng/L (ref <18)

## 2019-03-08 LAB — LIPASE, BLOOD: Lipase: 22 U/L (ref 11–51)

## 2019-03-08 LAB — SARS CORONAVIRUS 2 (TAT 6-24 HRS): SARS Coronavirus 2: NEGATIVE

## 2019-03-08 MED ORDER — HEPARIN SODIUM (PORCINE) 5000 UNIT/ML IJ SOLN
5000.0000 [IU] | Freq: Three times a day (TID) | INTRAMUSCULAR | Status: DC
  Administered 2019-03-08 – 2019-03-13 (×11): 5000 [IU] via SUBCUTANEOUS
  Filled 2019-03-08 (×13): qty 1

## 2019-03-08 MED ORDER — DOCUSATE SODIUM 100 MG PO CAPS
100.0000 mg | ORAL_CAPSULE | Freq: Two times a day (BID) | ORAL | Status: DC
  Administered 2019-03-08 – 2019-03-18 (×20): 100 mg via ORAL
  Filled 2019-03-08 (×20): qty 1

## 2019-03-08 MED ORDER — IOPAMIDOL (ISOVUE-300) INJECTION 61%
100.0000 mL | Freq: Once | INTRAVENOUS | Status: AC | PRN
  Administered 2019-03-08: 07:00:00 100 mL via INTRAVENOUS

## 2019-03-08 MED ORDER — ASPIRIN 81 MG PO CHEW
81.0000 mg | CHEWABLE_TABLET | Freq: Every day | ORAL | Status: DC
  Administered 2019-03-09 – 2019-03-15 (×7): 81 mg via ORAL
  Filled 2019-03-08 (×7): qty 1

## 2019-03-08 MED ORDER — ONDANSETRON HCL 4 MG PO TABS: 4.0000 mg | ORAL_TABLET | Freq: Four times a day (QID) | ORAL | Status: DC | PRN

## 2019-03-08 MED ORDER — OXYCODONE HCL 5 MG PO TABS: 5.0000 mg | ORAL_TABLET | ORAL | Status: DC | PRN

## 2019-03-08 MED ORDER — METOPROLOL TARTRATE 25 MG PO TABS
50.0000 mg | ORAL_TABLET | Freq: Two times a day (BID) | ORAL | Status: DC
  Administered 2019-03-08 – 2019-03-18 (×17): 50 mg via ORAL
  Filled 2019-03-08 (×18): qty 2

## 2019-03-08 MED ORDER — ONDANSETRON HCL 4 MG/2ML IJ SOLN: 4.0000 mg | Freq: Four times a day (QID) | INTRAMUSCULAR | Status: DC | PRN

## 2019-03-08 MED ORDER — RAMIPRIL 5 MG PO CAPS
5.0000 mg | ORAL_CAPSULE | Freq: Two times a day (BID) | ORAL | Status: DC
  Administered 2019-03-08 – 2019-03-18 (×20): 5 mg via ORAL
  Filled 2019-03-08 (×22): qty 1

## 2019-03-08 MED ORDER — AMLODIPINE BESYLATE 5 MG PO TABS
5.0000 mg | ORAL_TABLET | Freq: Every day | ORAL | Status: DC
  Administered 2019-03-08 – 2019-03-18 (×11): 5 mg via ORAL
  Filled 2019-03-08 (×11): qty 1

## 2019-03-08 MED ORDER — LACTATED RINGERS IV SOLN
INTRAVENOUS | Status: DC
  Administered 2019-03-09: 02:00:00 via INTRAVENOUS

## 2019-03-08 NOTE — ED Notes (Signed)
Patient transported to CT 

## 2019-03-08 NOTE — Progress Notes (Signed)
Patient admitted to unit from emergency department, alert and confused to time. Patient settled in bed and oriented to unit and hospital routine. Pt resting comfortably in bed with call bell in reach and side rails up. Instructed patient to utilize call bell for assistance. Will continue to monitor closely for remainder of shift.

## 2019-03-08 NOTE — ED Provider Notes (Signed)
Received patient at signout from Dr. Wyvonnia Dusky.  Refer to provider note for full history and physical examination.  Briefly patient is a 68 year old male with history of dementia presenting via EMS called out from a motel for right-sided pains.  Initial history from EMS mentioned leg pain however the patient denies this.  More so complaining of right flank and right abdominal pain.  No upper abdominal pain on examination.  The patient is extremely poor historian.  Lab work shows elevated LFTs which appear to be mostly chronic in nature although his alk phos is acutely elevated.  He is pending CT head and CT abdomen pelvis. Physical Exam  BP (!) 168/118 (BP Location: Left Arm)   Pulse 67   Temp 97.6 F (36.4 C) (Oral)   Resp 18   Wt 65 kg   SpO2 100%   BMI 19.43 kg/m   Physical Exam Vitals signs and nursing note reviewed.  Constitutional:      General: He is not in acute distress.    Appearance: He is well-developed.     Comments: Very thin  HENT:     Head: Normocephalic and atraumatic.  Eyes:     General:        Right eye: No discharge.        Left eye: No discharge.     Conjunctiva/sclera: Conjunctivae normal.  Neck:     Vascular: No JVD.     Trachea: No tracheal deviation.  Cardiovascular:     Rate and Rhythm: Normal rate.  Pulmonary:     Effort: Pulmonary effort is normal.  Abdominal:     General: A surgical scar is present. Bowel sounds are normal. There is no distension.     Palpations: Abdomen is soft.     Tenderness: There is no abdominal tenderness.     Comments: Large midline surgical scar.   Skin:    General: Skin is warm and dry.     Findings: No erythema.  Neurological:     Mental Status: He is alert.     Comments: Oriented to person only.  Follows most commands without difficulty.  Cranial nerves appear grossly intact.  Moves all extremities spontaneously without difficulty.  Psychiatric:        Behavior: Behavior normal.     ED Course/Procedures      Procedures Ct Head Wo Contrast  Result Date: 03/08/2019 CLINICAL DATA:  Initial evaluation for acute altered mental status. EXAM: CT HEAD WITHOUT CONTRAST TECHNIQUE: Contiguous axial images were obtained from the base of the skull through the vertex without intravenous contrast. COMPARISON:  Prior CT from 08/13/2015. FINDINGS: Brain: Cerebral generalized age-related cerebral atrophy with mild chronic small vessel ischemic disease. No evidence for acute intracranial hemorrhage. No findings to suggest acute large vessel territory infarct. No mass lesion, midline shift, or mass effect. Ventricles are normal in size without evidence for hydrocephalus. No extra-axial fluid collection identified. Vascular: No hyperdense vessel identified.Scattered vascular calcifications noted within the carotid siphons. Skull: Scalp soft tissues demonstrate no acute abnormality. Calvarium intact. Sinuses/Orbits: Globes and orbital soft tissues within normal limits. Mild mucoperiosteal thickening present within the ethmoidal air cells and maxillary sinuses. Paranasal sinuses are otherwise clear. No mastoid effusion. IMPRESSION: 1. No acute intracranial abnormality. 2. Mild age-related cerebral atrophy with chronic small vessel ischemic disease. Electronically Signed   By: Jeannine Boga M.D.   On: 03/08/2019 07:41   Ct Abdomen Pelvis W Contrast  Result Date: 03/08/2019 CLINICAL DATA:  Right hip pain for 2  months. EXAM: CT ABDOMEN AND PELVIS WITH CONTRAST TECHNIQUE: Multidetector CT imaging of the abdomen and pelvis was performed using the standard protocol following bolus administration of intravenous contrast. CONTRAST:  196m ISOVUE-300 IOPAMIDOL (ISOVUE-300) INJECTION 61% COMPARISON:  None. FINDINGS: Lower chest: Normal heart size. Dependent atelectasis within the bilateral lower lobes. No pleural effusion. Hepatobiliary: There is a large mass involving the hepatic dome and extending into the right hepatic lobe measuring  at least 15 x 12 x 15 cm. Mass is heterogeneous in attenuation with central areas of necrosis. There is heterogeneous enhancement within the right hepatic lobe (image 44; series 3) which may represent additional mass. Stones within the gallbladder lumen. No intrahepatic or extrahepatic biliary ductal dilatation. Pancreas: Unremarkable Spleen: Unremarkable Adrenals/Urinary Tract: Normal adrenal glands. Kidneys enhance symmetrically with contrast. No hydronephrosis. Urinary bladder is unremarkable. Stomach/Bowel: Normal morphology of the stomach. No evidence for bowel obstruction. No free fluid or free intraperitoneal air. Vascular/Lymphatic: Normal caliber abdominal aorta. Peripheral calcified atherosclerotic plaque. 1.7 cm porta hepatic node (image 37; series 3). Reproductive: Heterogeneous prostate. Other: None. Musculoskeletal: Lumbar spine degenerative changes. No aggressive or acute appearing osseous lesions. IMPRESSION: There is a large (15 cm) heterogeneously enhancing mass with central areas of non enhancement involving the hepatic dome and right hepatic lobe, nonspecific however concerning for the possibility of primary hepatic malignancy such as hepatocellular carcinoma. Metastasis is felt to be less likely. Enlarged porta hepatic lymph node which may represent nodal metastasis. Electronically Signed   By: DLovey NewcomerM.D.   On: 03/08/2019 08:01    MDM  Imaging concerning for 15 cm heterogenous LTruman Haywardenhancing mass with the central areas of non-enhancement involving the hepatic dome and right hepatic lobe.  Concerning for possible primary hepatic malignancy.  Patient tells me he smokes around a pack of cigarettes daily, occasionally smokes marijuana, does not drink alcohol however.  9:03AM CONSULT: Spoke with Dr. FBurr Medicowith oncology.  Due to the patient's dementia and lack of social support he will likely be lost to follow-up.  She recommends admission to the hospital for further evaluation of his liver  mass.  9:47 AM Spoke with the patient's daughter DLangley Gausswith the patient's permission; she tells me that [place].  He was found in the back of a stranger's pickup truck sleeping and the stranger took him to the hospital where he was admitted.  She reports she thinks his only diagnosis at the time was dementia.  [facility] [place] [phone] patient # [phone].  She states that the patient's sister picked him up from the hospital last week but he ran away from her again.  To the patient's daughter's knowledge she believes that he was in an assisted living facility or memory care facility but she was unsure.  She would appreciate updates while the patient is admitted.   10:30AM Spoke with Dr. YLorin Mercywith [facility] who agrees to assume care of patient and bring him into the hospital for further evaluation and management.    FRenita Papa PA-C 03/08/19 1053    SGareth Morgan MD 03/09/19 1026
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
## 2019-03-08 NOTE — ED Triage Notes (Addendum)
Pt transported from the parking lot of the [facility] [address] with various complaints of lower leg pain without injury. Pt ambulatory on scene.  A & O. VSS  Pt c/o R hip pain x 2 months, denies new injury, pt unable to recall medications.
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
## 2019-03-08 NOTE — Plan of Care (Signed)
  Problem: Clinical Measurements: Goal: Ability to maintain clinical measurements within normal limits will improve Outcome: Progressing   Problem: Coping: Goal: Level of anxiety will decrease Outcome: Progressing   

## 2019-03-09 LAB — CBC
HCT: 37.5 % — ABNORMAL LOW (ref 39.0–52.0)
Hemoglobin: 12.1 g/dL — ABNORMAL LOW (ref 13.0–17.0)
MCH: 29.1 pg (ref 26.0–34.0)
MCHC: 32.3 g/dL (ref 30.0–36.0)
MCV: 90.1 fL (ref 80.0–100.0)
Platelets: 252 10*3/uL (ref 150–400)
RBC: 4.16 MIL/uL — ABNORMAL LOW (ref 4.22–5.81)
RDW: 16.8 % — ABNORMAL HIGH (ref 11.5–15.5)
WBC: 4.6 10*3/uL (ref 4.0–10.5)
nRBC: 0 % (ref 0.0–0.2)

## 2019-03-09 LAB — COMPREHENSIVE METABOLIC PANEL
ALT: 68 U/L — ABNORMAL HIGH (ref 0–44)
AST: 72 U/L — ABNORMAL HIGH (ref 15–41)
Albumin: 2.5 g/dL — ABNORMAL LOW (ref 3.5–5.0)
Alkaline Phosphatase: 149 U/L — ABNORMAL HIGH (ref 38–126)
Anion gap: 9 (ref 5–15)
BUN: 18 mg/dL (ref 8–23)
CO2: 23 mmol/L (ref 22–32)
Calcium: 8.7 mg/dL — ABNORMAL LOW (ref 8.9–10.3)
Chloride: 104 mmol/L (ref 98–111)
Creatinine, Ser: 1.11 mg/dL (ref 0.61–1.24)
GFR calc Af Amer: >60 mL/min (ref >60)
GFR calc non Af Amer: >60 mL/min (ref >60)
Glucose, Bld: 71 mg/dL (ref 70–99)
Potassium: 4.1 mmol/L (ref 3.5–5.1)
Sodium: 136 mmol/L (ref 135–145)
Total Bilirubin: 0.7 mg/dL (ref 0.3–1.2)
Total Protein: 7.2 g/dL (ref 6.5–8.1)

## 2019-03-09 LAB — HIV ANTIBODY (ROUTINE TESTING W REFLEX): HIV Screen 4th Generation wRfx: NONREACTIVE

## 2019-03-09 LAB — AFP TUMOR MARKER: AFP, Serum, Tumor Marker: 56504 ng/mL — ABNORMAL HIGH (ref 0.0–8.3)

## 2019-03-09 LAB — PROTIME-INR
INR: 1.1 (ref 0.8–1.2)
Prothrombin Time: 13.9 seconds (ref 11.4–15.2)

## 2019-03-09 MED ORDER — CLOPIDOGREL BISULFATE 75 MG PO TABS
75.0000 mg | ORAL_TABLET | Freq: Every day | ORAL | Status: DC
  Administered 2019-03-09 – 2019-03-15 (×6): 75 mg via ORAL
  Filled 2019-03-09 (×7): qty 1
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
## 2019-03-09 NOTE — TOC Initial Note (Signed)
Transition of Care [facility]) - Initial/Assessment Note    Patient Details  Name: Timothy Zimmerman MRN: [id number] Date of Birth: March 04, 1951  Transition of Care [facility]) CM/SW Contact:    Marilu Favre, RN Phone Number: 03/09/2019, 2:13 PM  Clinical Narrative:                 Discussed discharge planning with patient. Address on face sheet is [facility], patient states he gets his cheques sent there.  Asked where patient was staying prior to admission , he stated " No where in particular" . Patient consented for NCM to call daughter Timothy Zimmerman [phone] called left message. Patient unsure of the last time he spoke to daughter.   Patient agreeable to SNF , faxed out.  Expected Discharge Plan: Skilled Nursing Facility Barriers to Discharge: Continued Medical Work up   Patient Goals and CMS Choice Patient states their goals for this hospitalization and ongoing recovery are:: to get stronger CMS Medicare.gov Compare Post Acute Care list provided to:: Patient Choice offered to / list presented to : Patient  Expected Discharge Plan and Services Expected Discharge Plan: [place]   Discharge Planning Services: CM Consult Post Acute Care Choice: [place] Living arrangements for the past 2 months: (Unknown patient stated " No where in particular ")                           HH Arranged: NA          Prior Living Arrangements/Services Living arrangements for the past 2 months: (Unknown patient stated " No where in particular ")   Patient language and need for interpreter reviewed:: Yes Do you feel safe going back to the place where you live?: Yes      Need for Family Participation in Patient Care: Yes (Comment) Care giver support system in place?: No (comment)   Criminal Activity/Legal Involvement Pertinent to Current Situation/Hospitalization: No - Comment as needed  Activities of Daily Living      Permission Sought/Granted   Permission  granted to share information with : Yes, Verbal Permission Granted  Share Information with NAME: Timothy Zimmerman [phone]     Permission granted to share info w Relationship: daughter     Emotional Assessment Appearance:: Appears stated age Attitude/Demeanor/Rapport: Engaged Affect (typically observed): Adaptable Orientation: : Oriented to  Time, Oriented to Self Alcohol / Substance Use: Not Applicable Psych Involvement: No (comment)  Admission diagnosis:  Liver mass [R16.0] Elevated LFTs [R94.5] Right lower quadrant abdominal pain [R10.31] Patient Active Problem List   Diagnosis Date Noted  . Liver mass 03/08/2019  . Dementia with behavioral disturbance ([place]) 03/08/2019  . HTN, goal below 130/80 03/11/2017  . Non-ST elevation (NSTEMI) myocardial infarction ([place]) 03/11/2017  . Hyperlipidemia LDL goal <70   . Chest pain 03/30/2014  . Coronary atherosclerosis of native coronary artery 03/30/2014  . Old myocardial infarction 03/30/2014  . Uncontrolled hypertension 03/30/2014   PCP:  Cathlean Cower, MD Pharmacy:   [facility], [place] - [address] [place] [address] Phone: [phone] Fax: [phone]  [id number] [address] [place] [address] Phone: [phone] Fax: [place], [place] - [address] [place] [address] Phone: [phone] Fax: [phone]     Social Determinants of Health (SDOH)  Interventions    Readmission Risk Interventions No flowsheet data found.
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
## 2019-03-09 NOTE — NC FL2 (Signed)
[place] LEVEL OF CARE SCREENING TOOL     IDENTIFICATION  Patient Name: Timothy Zimmerman Birthdate: 29-May-1951 Sex: male Admission Date (Current Location): 03/08/2019  [facility] and [place] Number:  Herbalist and Address:  [facility], [place] [address] [place], [place] [address]      Provider Number: [id number]  Attending Physician Name and Address:  Rodena Goldmann, DO  Relative Name and Phone Number:  Jonnie Truxillo [phone]    Current Level of Care: Hospital Recommended Level of Care: [place] Prior Approval Number:    Date Approved/Denied:   PASRR Number: [id number]  Discharge Plan: SNF    Current Diagnoses: Patient Active Problem List   Diagnosis Date Noted  . Liver mass 03/08/2019  . Dementia with behavioral disturbance ([place]) 03/08/2019  . HTN, goal below 130/80 03/11/2017  . Non-ST elevation (NSTEMI) myocardial infarction ([place]) 03/11/2017  . Hyperlipidemia LDL goal <70   . Chest pain 03/30/2014  . Coronary atherosclerosis of native coronary artery 03/30/2014  . Old myocardial infarction 03/30/2014  . Uncontrolled hypertension 03/30/2014    Orientation RESPIRATION BLADDER Height & Weight     Place, Self, Situation(knows year , not month or day)  Normal Continent Weight: 64.3 kg Height:  6' (182.9 cm)  BEHAVIORAL SYMPTOMS/MOOD NEUROLOGICAL BOWEL NUTRITION STATUS      Continent Diet  AMBULATORY STATUS COMMUNICATION OF NEEDS Skin   Limited Assist Verbally Normal                       Personal Care Assistance Level of Assistance  Bathing, Dressing, Feeding Bathing Assistance: Limited assistance Feeding assistance: Limited assistance Dressing Assistance: Limited assistance     Functional Limitations Info             SPECIAL CARE FACTORS FREQUENCY  PT (By licensed PT), OT (By licensed OT)     PT Frequency: 5 times a week OT Frequency: 5 times a week            Contractures  Contractures Info: Not present    Additional Factors Info  Code Status, Allergies Code Status Info: Full Allergies Info: no known allergies           Current Medications (03/09/2019):  This is the current hospital active medication list Current Facility-Administered Medications  Medication Dose Route Frequency Provider Last Rate Last Dose  . amLODipine (NORVASC) tablet 5 mg  5 mg Oral Daily Karmen Bongo, MD   5 mg at 03/09/19 0946  . aspirin chewable tablet 81 mg  81 mg Oral Daily Karmen Bongo, MD   81 mg at 03/09/19 0946  . clopidogrel (PLAVIX) tablet 75 mg  75 mg Oral Q breakfast Heath Lark D, DO   75 mg at 03/09/19 0946  . docusate sodium (COLACE) capsule 100 mg  100 mg Oral BID Karmen Bongo, MD   100 mg at 03/09/19 0946  . heparin injection 5,000 Units  5,000 Units Subcutaneous Lynne Logan, MD   5,000 Units at 03/09/19 1413  . metoprolol tartrate (LOPRESSOR) tablet 50 mg  50 mg Oral BID Karmen Bongo, MD   50 mg at 03/09/19 0946  . ondansetron (ZOFRAN) tablet 4 mg  4 mg Oral Q6H PRN Karmen Bongo, MD       Or  . ondansetron [facility]) injection 4 mg  4 mg Intravenous Q6H PRN Karmen Bongo, MD      . oxyCODONE (Oxy IR/ROXICODONE) immediate  release tablet 5 mg  5 mg Oral Q4H PRN Karmen Bongo, MD      . ramipril (ALTACE) capsule 5 mg  5 mg Oral BID Karmen Bongo, MD   5 mg at 03/09/19 8441     Discharge Medications: Please see discharge summary for a list of discharge medications.  Relevant Imaging Results:  Relevant Lab Results:   Additional Information SSI [id number]  Nyxon Strupp, Edson Snowball, RN

## 2019-03-09 NOTE — Progress Notes (Signed)
Patient ID: Timothy Zimmerman, male   DOB: 1950-11-29, 68 y.o.   MRN: [id number]   Received request for liver lesion biopsy  Imaging was reviewed by Dr Vernard Gambles  Rec: check AFP- if abnormal consider [facility]  Large mass would be appropriate for biopsy - IF NEEDED- After work up  Let us know

## 2019-03-10 ENCOUNTER — Inpatient Hospital Stay (HOSPITAL_COMMUNITY): Payer: Medicare Other

## 2019-03-10 ENCOUNTER — Other Ambulatory Visit: Payer: Self-pay

## 2019-03-10 DIAGNOSIS — F0390 Unspecified dementia without behavioral disturbance: Secondary | ICD-10-CM

## 2019-03-10 DIAGNOSIS — C220 Liver cell carcinoma: Principal | ICD-10-CM

## 2019-03-10 DIAGNOSIS — I10 Essential (primary) hypertension: Secondary | ICD-10-CM

## 2019-03-10 LAB — COMPREHENSIVE METABOLIC PANEL
ALT: 69 U/L — ABNORMAL HIGH (ref 0–44)
AST: 82 U/L — ABNORMAL HIGH (ref 15–41)
Albumin: 2.6 g/dL — ABNORMAL LOW (ref 3.5–5.0)
Alkaline Phosphatase: 143 U/L — ABNORMAL HIGH (ref 38–126)
Anion gap: 10 (ref 5–15)
BUN: 14 mg/dL (ref 8–23)
CO2: 24 mmol/L (ref 22–32)
Calcium: 8.9 mg/dL (ref 8.9–10.3)
Chloride: 103 mmol/L (ref 98–111)
Creatinine, Ser: 0.88 mg/dL (ref 0.61–1.24)
GFR calc Af Amer: >60 mL/min (ref >60)
GFR calc non Af Amer: >60 mL/min (ref >60)
Glucose, Bld: 69 mg/dL — ABNORMAL LOW (ref 70–99)
Potassium: 4.3 mmol/L (ref 3.5–5.1)
Sodium: 137 mmol/L (ref 135–145)
Total Bilirubin: 0.8 mg/dL (ref 0.3–1.2)
Total Protein: 7.4 g/dL (ref 6.5–8.1)

## 2019-03-10 MED ORDER — ALPRAZOLAM 0.5 MG PO TABS
1.0000 mg | ORAL_TABLET | Freq: Once | ORAL | Status: AC
  Administered 2019-03-11: 10:00:00 1 mg via ORAL
  Filled 2019-03-10: qty 2

## 2019-03-10 NOTE — TOC Progression Note (Addendum)
Transition of Care [facility]) - Progression Note    Patient Details  Name: Timothy Zimmerman MRN: [id number] Date of Birth: [birth year]  Transition of Care [facility]) CM/SW [place], [place] Phone Number: 03/10/2019, 12:20 PM  Clinical Narrative:    2:58pm- CSW attempted to call pt daughter again, no answer. CSW called to pt room and introduced self and role. Let pt know that we had called his daughter with no response. Asked if there is another family member we could call; pt acknowledges that he has a sister but hesitated to give CSW her name stating "she's gonna be trouble." CSW discussed that we would not call pt sister without his permission; pt finally states his sister is Customer service manager. Pt then became angry stating "you're trying to rile up trouble," and hung up on this Probation officer.   12:20pm- HIPAA compliant message left for Aime Meloche at [phone]   Expected Discharge Plan: [place] Barriers to Discharge: Continued Medical Work up  Expected Discharge Plan and Services Expected Discharge Plan: [place]   Discharge Planning Services: CM Consult Post Acute Care Choice: [place] Living arrangements for the past 2 months: (Unknown patient stated " No where in particular ")                           HH Arranged: NA           Social Determinants of Health (SDOH) Interventions    Readmission Risk Interventions No flowsheet data found.
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
## 2019-03-11 ENCOUNTER — Inpatient Hospital Stay (HOSPITAL_COMMUNITY): Payer: Medicare Other

## 2019-03-11 DIAGNOSIS — F0391 Unspecified dementia with behavioral disturbance: Secondary | ICD-10-CM

## 2019-03-11 DIAGNOSIS — I2510 Atherosclerotic heart disease of native coronary artery without angina pectoris: Secondary | ICD-10-CM

## 2019-03-11 LAB — COMPREHENSIVE METABOLIC PANEL
ALT: 76 U/L — ABNORMAL HIGH (ref 0–44)
AST: 89 U/L — ABNORMAL HIGH (ref 15–41)
Albumin: 2.6 g/dL — ABNORMAL LOW (ref 3.5–5.0)
Alkaline Phosphatase: 164 U/L — ABNORMAL HIGH (ref 38–126)
Anion gap: 9 (ref 5–15)
BUN: 19 mg/dL (ref 8–23)
CO2: 25 mmol/L (ref 22–32)
Calcium: 8.9 mg/dL (ref 8.9–10.3)
Chloride: 104 mmol/L (ref 98–111)
Creatinine, Ser: 1.05 mg/dL (ref 0.61–1.24)
GFR calc Af Amer: >60 mL/min (ref >60)
GFR calc non Af Amer: >60 mL/min (ref >60)
Glucose, Bld: 86 mg/dL (ref 70–99)
Potassium: 4.4 mmol/L (ref 3.5–5.1)
Sodium: 138 mmol/L (ref 135–145)
Total Bilirubin: 0.5 mg/dL (ref 0.3–1.2)
Total Protein: 7.1 g/dL (ref 6.5–8.1)

## 2019-03-11 MED ORDER — GADOBUTROL 1 MMOL/ML IV SOLN
6.0000 mL | Freq: Once | INTRAVENOUS | Status: AC | PRN
  Administered 2019-03-11: 6 mL via INTRAVENOUS

## 2019-03-11 NOTE — Care Management Important Message (Signed)
Important Message  Patient Details  Name: Timothy Zimmerman MRN: [id number] Date of Birth: January 15, 1951   Medicare Important Message Given:        Memory Argue 03/11/2019, 3:43 PM

## 2019-03-11 NOTE — Progress Notes (Signed)
Oncology brief note  WE discussed his case in our GI tumor board this morning. Dr. Barry Dienes feels he is not a candidate for surgery due to his dementia and size of his tumor. IR Dr. Pascal Lux feels Y-90 or bland embolization would be an option if he is interested. I have requested IR to talk to him while he is in hospital, to plan his treatment.   Hospital discharge per primary team, I will see him back in my clinic in 2-3 weeks after discharge if he is able to return.    Timothy Zimmerman  03/11/2019
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
## 2019-03-11 NOTE — TOC Progression Note (Addendum)
Transition of Care [facility]) - Progression Note    Patient Details  Name: Timothy Zimmerman MRN: [id number] Date of Birth: 1950-09-21  Transition of [facility]) CM/SW Contact  Jacalyn Lefevre Edson Snowball, RN Phone Number: 03/11/2019, 2:00 PM  Clinical Narrative:     Left another voicemail for daughter [name]. Discussed with patient . His brother has passed away and his sister is "crazy DO NOT call her".  Called numbers on face sheet [phone] left voicemail, [phone] is no longer in service.  Will discuss with NCM lead.  Expected Discharge Plan: [place] Barriers to Discharge: Continued Medical Work up  Expected Discharge Plan and Services Expected Discharge Plan: [place]   Discharge Planning Services: CM Consult Post Acute Care Choice: [place] Living arrangements for the past 2 months: (Unknown patient stated " No where in particular ")                           HH Arranged: NA           Social Determinants of Health (SDOH) Interventions    Readmission Risk Interventions No flowsheet data found.

## 2019-03-11 NOTE — Progress Notes (Signed)
Pt been NPO since 6PM yesterday. Called MRI to follow up when pt going to get test done. MRI said approximately around 9-11am. Made aware to call 30 mins before for pt need to be given anxiety meds before test. Will endorse appropriately

## 2019-03-12 DIAGNOSIS — Z008 Encounter for other general examination: Secondary | ICD-10-CM

## 2019-03-12 LAB — CBC WITH DIFFERENTIAL/PLATELET
Abs Immature Granulocytes: 0.05 10*3/uL (ref 0.00–0.07)
Basophils Absolute: 0 10*3/uL (ref 0.0–0.1)
Basophils Relative: 1 %
Eosinophils Absolute: 0.2 10*3/uL (ref 0.0–0.5)
Eosinophils Relative: 3 %
HCT: 41.8 % (ref 39.0–52.0)
Hemoglobin: 13.3 g/dL (ref 13.0–17.0)
Immature Granulocytes: 1 %
Lymphocytes Relative: 45 %
Lymphs Abs: 2.3 10*3/uL (ref 0.7–4.0)
MCH: 29 pg (ref 26.0–34.0)
MCHC: 31.8 g/dL (ref 30.0–36.0)
MCV: 91.1 fL (ref 80.0–100.0)
Monocytes Absolute: 0.4 10*3/uL (ref 0.1–1.0)
Monocytes Relative: 8 %
Neutro Abs: 2.2 10*3/uL (ref 1.7–7.7)
Neutrophils Relative %: 42 %
Platelets: 322 10*3/uL (ref 150–400)
RBC: 4.59 MIL/uL (ref 4.22–5.81)
RDW: 17.2 % — ABNORMAL HIGH (ref 11.5–15.5)
WBC: 5.2 10*3/uL (ref 4.0–10.5)
nRBC: 0 % (ref 0.0–0.2)

## 2019-03-12 LAB — COMPREHENSIVE METABOLIC PANEL
ALT: 83 U/L — ABNORMAL HIGH (ref 0–44)
AST: 95 U/L — ABNORMAL HIGH (ref 15–41)
Albumin: 2.6 g/dL — ABNORMAL LOW (ref 3.5–5.0)
Alkaline Phosphatase: 169 U/L — ABNORMAL HIGH (ref 38–126)
Anion gap: 11 (ref 5–15)
BUN: 19 mg/dL (ref 8–23)
CO2: 23 mmol/L (ref 22–32)
Calcium: 8.9 mg/dL (ref 8.9–10.3)
Chloride: 101 mmol/L (ref 98–111)
Creatinine, Ser: 0.83 mg/dL (ref 0.61–1.24)
GFR calc Af Amer: >60 mL/min (ref >60)
GFR calc non Af Amer: >60 mL/min (ref >60)
Glucose, Bld: 84 mg/dL (ref 70–99)
Potassium: 4.2 mmol/L (ref 3.5–5.1)
Sodium: 135 mmol/L (ref 135–145)
Total Bilirubin: 0.6 mg/dL (ref 0.3–1.2)
Total Protein: 7.4 g/dL (ref 6.5–8.1)

## 2019-03-12 NOTE — TOC Progression Note (Signed)
Transition of Care [facility] [place]) - Progression Note    Patient Details  Name: Timothy Zimmerman MRN: [id number] Date of Birth: April 16, 1951  Transition of Care [facility]) CM/SW [place], [place] Phone Number: 03/12/2019, 1:53 PM  Clinical Narrative:    CSW spoke with [facility] supervisor Barbette Or, was able to locate two additional numbers listed for pt daughter [phone], also provided [phone], HIPAA compliant message left for pt daughter at that number.  If continues to not be any call back; and pending psych recommendations for decision making will need to first do wellness check for daughter and then contact DSS.    Expected Discharge Plan: [place] Barriers to Discharge: Continued Medical Work up  Expected Discharge Plan and Services Expected Discharge Plan: [place]   Discharge Planning Services: CM Consult Post Acute Care Choice: [place] Living arrangements for the past 2 months: (Unknown patient stated " No where in particular ")                           HH Arranged: NA           Social Determinants of Health (SDOH) Interventions    Readmission Risk Interventions No flowsheet data found.

## 2019-03-12 NOTE — TOC Progression Note (Signed)
Transition of Care [facility]) - Progression Note    Patient Details  Name: FAHIM KATS MRN: [id number] Date of Birth: 08-04-1951  Transition of Care [facility]) CM/SW Contact  Jacalyn Lefevre Edson Snowball, RN Phone Number: 03/12/2019, 10:02 AM  Clinical Narrative:     No return call from patient's daughter. Patient states he has a sister Sherlynn Stalls but he does not want anyone to call her. Patient did not provide Esther's last name or phone number. Called numbers on face sheet left message on number listed as patient's home phone, other number is out of service.   Will ask MD if psych consult would be helpful to determine if patient can make his own decisions.   Patient has one bed offer, however, if he needs any cancer treatment they cannot take him.   Expected Discharge Plan: [place] Barriers to Discharge: Continued Medical Work up  Expected Discharge Plan and Services Expected Discharge Plan: [place]   Discharge Planning Services: CM Consult Post Acute Care Choice: [place] Living arrangements for the past 2 months: (Unknown patient stated " No where in particular ")                           HH Arranged: NA           Social Determinants of Health (SDOH) Interventions    Readmission Risk Interventions No flowsheet data found.

## 2019-03-12 NOTE — Progress Notes (Signed)
Psychiatry is doing eval using ipad.
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
## 2019-03-12 NOTE — Care Management Important Message (Signed)
Important Message  Patient Details  Name: Timothy Zimmerman MRN: [id number] Date of Birth: Mar 01, 1951   Medicare Important Message Given:  Yes     Meet [place] 03/12/2019, 1:55 PM

## 2019-03-13 DIAGNOSIS — G893 Neoplasm related pain (acute) (chronic): Secondary | ICD-10-CM

## 2019-03-13 DIAGNOSIS — C772 Secondary and unspecified malignant neoplasm of intra-abdominal lymph nodes: Secondary | ICD-10-CM

## 2019-03-13 DIAGNOSIS — Z515 Encounter for palliative care: Secondary | ICD-10-CM

## 2019-03-13 MED ORDER — ENOXAPARIN SODIUM 40 MG/0.4ML ~~LOC~~ SOLN
40.0000 mg | SUBCUTANEOUS | Status: DC
  Administered 2019-03-13 – 2019-03-15 (×3): 40 mg via SUBCUTANEOUS
  Filled 2019-03-13 (×3): qty 0.4

## 2019-03-13 NOTE — Progress Notes (Signed)
Timothy Zimmerman   DOB:[birth year]   YW#:[id number]   SWN#:[id number]  Oncology follow up  Subjective: Pt resting comfortably in bed this morning.  He still has mild abdominal pain, controlled, no other new complaints.   Objective:  Vitals:   03/12/19 2051 03/13/19 0507  BP: 137/87 (!) 141/92  Pulse: (!) 54 (!) 57  Resp: 17 18  Temp: 98.5 F (36.9 C) 98.5 F (36.9 C)  SpO2: 100% 99%    Body mass index is 19.23 kg/m.  Intake/Output Summary (Last 24 hours) at 03/13/2019 0810 Last data filed at 03/13/2019 0800 Gross per 24 hour  Intake 240 ml  Output 1300 ml  Net -1060 ml     Sclerae unicteric  Oropharynx clear  No peripheral adenopathy  Lungs clear -- no rales or rhonchi  Heart regular rate and rhythm  Abdomen soft, (+) tenderness at RUQ  MSK no focal spinal tenderness, no peripheral edema  Neuro nonfocal    CBG (last 3)  No results for input(s): GLUCAP in the last 72 hours.   Labs:  Lab Results  Component Value Date   WBC 5.2 03/12/2019   HGB 13.3 03/12/2019   HCT 41.8 03/12/2019   MCV 91.1 03/12/2019   PLT 322 03/12/2019   NEUTROABS 2.2 03/12/2019    Urine Studies No results for input(s): UHGB, CRYS in the last 72 hours.  Invalid input(s): UACOL, UAPR, USPG, UPH, UTP, UGL, UKET, UBIL, UNIT, UROB, ULEU, UEPI, UWBC, Duwayne Heck [place], [place]  Basic Metabolic Panel: Recent Labs  Lab 03/08/19 0601 03/09/19 0226 03/10/19 0159 03/11/19 0228 03/12/19 0340  NA 139 136 137 138 135  K 3.9 4.1 4.3 4.4 4.2  CL 104 104 103 104 101  CO2 24 23 24 25 23   GLUCOSE 74 71 69* 86 84  BUN 15 18 14 19 19   CREATININE 1.00 1.11 0.88 1.05 0.83  CALCIUM 8.9 8.7* 8.9 8.9 8.9   GFR Estimated Creatinine Clearance: 75.3 mL/min (by C-G formula based on SCr of 0.83 mg/dL). Liver Function Tests: Recent Labs  Lab 03/08/19 0601 03/09/19 0226 03/10/19 0159 03/11/19 0228 03/12/19 0340  AST 73* 72* 82* 89* 95*  ALT 72* 68* 69* 76* 83*  ALKPHOS 139* 149* 143* 164* 169*   BILITOT 0.8 0.7 0.8 0.5 0.6  PROT 7.2 7.2 7.4 7.1 7.4  ALBUMIN 2.7* 2.5* 2.6* 2.6* 2.6*   Recent Labs  Lab 03/08/19 0601  LIPASE 22   No results for input(s): AMMONIA in the last 168 hours. Coagulation profile Recent Labs  Lab 03/09/19 0226  INR 1.1    CBC: Recent Labs  Lab 03/08/19 0601 03/09/19 0226 03/12/19 0340  WBC 3.8* 4.6 5.2  NEUTROABS 1.7  --  2.2  HGB 11.9* 12.1* 13.3  HCT 36.8* 37.5* 41.8  MCV 90.9 90.1 91.1  PLT 245 252 322   Cardiac Enzymes: Recent Labs  Lab 03/08/19 0601  CKTOTAL 223   BNP: Invalid input(s): POCBNP CBG: No results for input(s): GLUCAP in the last 168 hours. D-Dimer No results for input(s): DDIMER in the last 72 hours. Hgb A1c No results for input(s): HGBA1C in the last 72 hours. Lipid Profile No results for input(s): CHOL, HDL, LDLCALC, TRIG, CHOLHDL, LDLDIRECT in the last 72 hours. Thyroid function studies No results for input(s): TSH, T4TOTAL, T3FREE, THYROIDAB in the last 72 hours.  Invalid input(s): FREET3 Anemia work up No results for input(s): VITAMINB12, FOLATE, FERRITIN, TIBC, IRON, RETICCTPCT in the last 72 hours. Microbiology Recent Results (from  the past 240 hour(s))  SARS CORONAVIRUS 2 Nasal Swab Aptima Multi Swab     Status: None   Collection Time: 03/08/19 11:04 AM   Specimen: Aptima Multi Swab; Nasal Swab  Result Value Ref Range Status   SARS Coronavirus 2 NEGATIVE NEGATIVE Final    Comment: (NOTE) SARS-CoV-2 target nucleic acids are NOT DETECTED. The SARS-CoV-2 RNA is generally detectable in upper and lower respiratory specimens during the acute phase of infection. Negative results do not preclude SARS-CoV-2 infection, do not rule out co-infections with other pathogens, and should not be used as the sole basis for treatment or other patient management decisions. Negative results must be combined with clinical observations, patient history, and epidemiological information. The expected result is  Negative. Fact Sheet for Patients: SugarRoll.be Fact Sheet for Healthcare Providers: https://www.woods-mathews.com/ This test is not yet approved or cleared by the [place] FDA and  has been authorized for detection and/or diagnosis of SARS-CoV-2 by FDA under an Emergency Use Authorization (EUA). This EUA will remain  in effect (meaning this test can be used) for the duration of the COVID-19 declaration under Section 56 4(b)(1) of the Act, 21 U.S.C. section 360bbb-3(b)(1), unless the authorization is terminated or revoked sooner. Performed at [facility], [place] [address] [place], [place] [address]       Studies:  Mr Abdomen W Wo Contrast  Result Date: 03/11/2019 CLINICAL DATA:  Evaluate liver lesions EXAM: MRI ABDOMEN WITHOUT AND WITH CONTRAST TECHNIQUE: Multiplanar multisequence MR imaging of the abdomen was performed both before and after the administration of intravenous contrast. CONTRAST:  7 cc of Gadavist COMPARISON:  CT AP 03/08/2019 FINDINGS: Diminished exam detail secondary to motion artifact. Lower chest: No acute findings. Hepatobiliary: Multifocal liver lesions are identified involving both lobes of the liver. These lesions exhibit early, heterogeneous arterial phase enhancement. Areas of arterial phase enhancement do exhibit washout on the delayed images which may reflect hepatoma. Confluent mass replacing most of segment 8 measures 14.0 by 11.7 by 11.9 cm, image 27/14. Segment 5 and segment 7 lesion measures 5.6 x 2.9 by 6.8 cm, image 26/5. Lateral segment left lobe of liver lesion adjacent to the falciform ligament measures 2.4 by 1.8 by 1.8 cm, image 20/5. Gallstones are noted measuring up to 1.3 cm. No gallbladder wall inflammation. No significant bile duct dilatation. Pancreas: No mass, inflammatory changes, or other parenchymal abnormality identified. Spleen:  Within normal limits in size and appearance. Adrenals/Urinary  Tract: Normal appearance of the adrenal glands. The kidneys are unremarkable. No hydronephrosis identified bilaterally. Stomach/Bowel: Visualized portions within the abdomen are unremarkable. Vascular/Lymphatic: Normal appearance of the abdominal aorta. No aneurysm. The main portal vein appears grossly patent. There is considerable respiratory motion artifact limiting assessment of the intrahepatic portal veins. No definite evidence for tumor thrombus. 1.6 cm porta hepatic lymph node is noted, image 21/5. Other:  None Musculoskeletal: No suspicious bone lesions identified. IMPRESSION: 1. Motion artifact results in diminished exam detail, particularly on the postcontrast enhanced images. 2. There are multifocal liver lesions involving both lobes of liver. Findings are highly concerning for malignancy. Given the heterogeneous, early arterial phase enhancement with washout on delayed images hepatoma is favored. Correlation with biopsy is advised. 3. Within the limitations of motion artifact there is no definite evidence for tumor thrombus within the portal vein or its branches. 4. Enlarged porta hepatic lymph node is identified concerning for metastatic adenopathy. Electronically Signed   By: Kerby Moors M.D.   On: 03/11/2019 12:15  Assessment: 67 y.o. AAM, with PMH of HTN, dementia presented with RUQ abdominal pain   1.  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 HTN 3. Dementia, lack of capacity  4. Abdominal pain secondary to #1   Plan:  -pt was evaluated by psychiatry yesterday and he does not have capacity to make informed decisions  -we have not been able to reach of his daughter or other family members. He does not have a phone. He says his sister [place] Yaffe lives in [place], in an brick apartment on [address], she is in [address]. He does not have her phone number.  -Given his advanced liver cancer which is not curable, and we are not  able to offer palliative cancer treatment due to his lack of capacity and guardian, I recommend hospice and palliative care. He probably would need SNF placement since he is homeless and his medical issues. Disposition per primary team.   Truitt Merle, MD 03/13/2019  8:10 AM
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
## 2019-03-13 NOTE — Plan of Care (Signed)
  Problem: Pain Managment: Goal: General experience of comfort will improve Outcome: Progressing   

## 2019-03-13 NOTE — TOC Progression Note (Signed)
Transition of Care [facility]) - Progression Note    Patient Details  Name: CARLE FENECH MRN: [id number] Date of Birth: [birth year]  Transition of [facility]) CM/SW Contact  Ella Guillotte, Edson Snowball, RN Phone Number: 03/13/2019, 1:27 PM  Clinical Narrative:    Received a call today from patient's daughter Rande Roylance , she is aware her father is in the hospital. She is willing to be decision maker for her father.She is requesting MD covering her father to call her today. Messaged MD    Expected Discharge Plan: [employer] Barriers to Discharge: Continued Medical Work up  Expected Discharge Plan and Services Expected Discharge Plan: [place]   Discharge Planning Services: CM Consult Post Acute Care Choice: [place] Living arrangements for the past 2 months: (Unknown patient stated " No where in particular ")                           HH Arranged: NA           Social Determinants of Health (SDOH) Interventions    Readmission Risk Interventions No flowsheet data found.

## 2019-03-13 NOTE — TOC Progression Note (Addendum)
Transition of Care [facility]) - Progression Note    Patient Details  Name: Timothy Zimmerman MRN: [id number] Date of Birth: 1951-06-27  Transition of Care [facility]) CM/SW [place], [place] Phone Number: 03/13/2019, 12:26 PM  Clinical Narrative:    CSW has spoken with [place]; further efforts to find pt sister "Paxten Appelt" is underway. Wellness check made for Doyce Para through [facility] PD nonemergency line.    Expected Discharge Plan: [place] Barriers to Discharge: Continued Medical Work up  Expected Discharge Plan and Services Expected Discharge Plan: [place]   Discharge Planning Services: CM Consult Post Acute Care Choice: [place] Living arrangements for the past 2 months: (Unknown patient stated " No where in particular ") HH Arranged: NA   Social Determinants of Health (SDOH) Interventions    Readmission Risk Interventions No flowsheet data found.
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
## 2019-03-16 DIAGNOSIS — R109 Unspecified abdominal pain: Secondary | ICD-10-CM

## 2019-03-16 DIAGNOSIS — F0280 Dementia in other diseases classified elsewhere without behavioral disturbance: Secondary | ICD-10-CM

## 2019-03-16 DIAGNOSIS — Z515 Encounter for palliative care: Secondary | ICD-10-CM

## 2019-03-16 DIAGNOSIS — Z66 Do not resuscitate: Secondary | ICD-10-CM

## 2019-03-16 DIAGNOSIS — R1010 Upper abdominal pain, unspecified: Secondary | ICD-10-CM

## 2019-03-16 MED ORDER — SENNA 8.6 MG PO TABS: 2.0000 | ORAL_TABLET | Freq: Every day | ORAL | Status: DC | PRN

## 2019-03-16 MED ORDER — SENNOSIDES-DOCUSATE SODIUM 8.6-50 MG PO TABS
2.0000 | ORAL_TABLET | Freq: Once | ORAL | Status: AC
  Administered 2019-03-16: 17:00:00 2 via ORAL
  Filled 2019-03-16: qty 2

## 2019-03-16 MED ORDER — DEXAMETHASONE 4 MG PO TABS
2.0000 mg | ORAL_TABLET | Freq: Every day | ORAL | Status: DC
  Administered 2019-03-16 – 2019-03-18 (×3): 2 mg via ORAL
  Filled 2019-03-16 (×3): qty 1
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
## 2019-03-16 NOTE — Progress Notes (Signed)
Oncology brief note  I got a message from [place] that pt's daughter [name] wants to speak with me. I called her back, and we reviewed his diagnosis, incurable nature of his liver cancer, the treatment options, especially the detail of Y90, and immunotherapy. She voiced good understanding, and will think about it and discuss with her father. I also discuss that hospice is a very reasonable decision given his other medical condition especially dementia. [name] understand that hospice does not cover any of the above cancer treatment.  She still wants her father to go to a facility in [place] where she lives, with hospice, at this point.  If she decides to that her father try any liver cancer treatment, she needs to find a Museum/gallery conservator in [place]. I encourage her to call me if she has further questions in the future.  She appreciated the call.  Truitt Merle  03/16/2019
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
## 2019-03-16 NOTE — Progress Notes (Signed)
Paged MD via secure chat and Amion requesting she update the chart to reflect DNR per Florentina Jenny, PA latest note 03/16/19. No response.   Provided report to night shift RN.

## 2019-03-16 NOTE — TOC Progression Note (Addendum)
Transition of Care [facility]) - Progression Note    Patient Details  Name: Timothy Zimmerman MRN: [id number] Date of Birth: Sep 11, 1950  [facility]) CM/SW Contact  Jacalyn Lefevre Edson Snowball, RN Phone Number: 03/16/2019, 11:02 AM  Clinical Narrative:     Patient medically ready for discharge . Patient currently has one bed at [facility], daughter agreeable.   [place] now will not have a bed until Wednesday . Spoke to daughter [name] [phone] and made aware, refaxed patient to [facility] , [place] , [place] and [place]. Will call daughter back once receive offers. MD aware.  Patient will need covid test within 24 hours of discharge, will wait until NCM has confirmation on SNF bed before asking for order. Daughter [name] aware.  Potential bed offer from [place] at [place], called and left Debbie in admission .   Expected Discharge Plan: [place] Barriers to Discharge: Continued Medical Work up  Expected Discharge Plan and Services Expected Discharge Plan: [place]   Discharge Planning Services: CM Consult Post Acute Care Choice: [place] Living arrangements for the past 2 months: (Unknown patient stated " No where in particular ")                           HH Arranged: NA           Social Determinants of Health (SDOH) Interventions    Readmission Risk Interventions No flowsheet data found.

## 2019-03-16 NOTE — Care Management Important Message (Signed)
Important Message  Patient Details  Name: Timothy Zimmerman MRN: [id number] Date of Birth: 1951-05-21   Medicare Important Message Given:  Yes     Memory Argue 03/16/2019, 4:20 PM

## 2019-03-17 ENCOUNTER — Other Ambulatory Visit: Payer: Self-pay

## 2019-03-17 LAB — SARS CORONAVIRUS 2 (TAT 6-24 HRS): SARS Coronavirus 2: NEGATIVE

## 2019-03-17 NOTE — Plan of Care (Signed)

## 2019-03-17 NOTE — Care Management (Addendum)
Daughter requesting SNF with locked unit ( patient has not had any wandering issues this admission), called [place] spoke with [facility], due to covid they are not accepting any admissions for at least a week.   Left second message for Irven Shelling at [facility] [phone]. Debbie returned call and confirmed she can offer bed.   Left message for Arbie Cookey at [facility] [phone]. Arbie Cookey aware daughter picked [facility].  Juliann Pulse with [facility] can take patient tomorrow.    Patient will need new covid test regardless of which SNF.   Called patient's daughter [name] and provided bed offers. Explained no locked unit bed available. However, if her father goes to a SNF and wandering becomes an issue the SW there can assist in transferring to a locked unit. Again he has not wandered here. [name]  will research and call NCM back.   [name] has selected [facility] . Juliann Pulse at [facility] aware. Texted MD for rapid covid test today and to sign gold DNR form.    Debbie at [facility] aware also.   Magdalen Spatz RN
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
## 2019-03-17 NOTE — Progress Notes (Signed)
MDT recommendations 03/11/19:  [place] due to his dementia and size of his tumor. IR: Dr. Pascal Lux feels Y-90 or bland embolization would be an option if he is interested. Hospice is an option related to medical conditions.

## 2019-03-18 NOTE — Progress Notes (Signed)
[place]. Report given to [place], Therapist, sports.

## 2019-03-18 NOTE — TOC Transition Note (Addendum)
Transition of Care [facility]) - CM/SW Discharge Note   Patient Details  Name: Timothy Zimmerman MRN: [id number] Date of Birth: [birth year]  Transition of Care [facility]) CM/SW Contact:  Marilu Favre, RN Phone Number: 03/18/2019, 10:03 AM   Clinical Narrative:    Plan for discharge today to [facility]. Awaiting call back with room number and number for nurse to call report.   Rm number will be [id number], number to call report is [phone]. Nurse aware and will call. Per nurse patient will be ready at 12 noon. PTAR called.   Jeshawn Melucci [phone] aware patient is discharging today to [facility] at noon. [name] has [employer] address , phone number and her father's room number.   Final next level of care: Skilled Nursing Facility Barriers to Discharge: Continued Medical Work up   Patient Goals and CMS Choice Patient states their goals for this hospitalization and ongoing recovery are:: to get stronger CMS Medicare.gov Compare Post Acute Care list provided to:: Patient Choice offered to / list presented to : Patient  Discharge Placement                       Discharge Plan and Services   Discharge Planning Services: CM Consult Post Acute Care Choice: Skilled Nursing Facility                    HH Arranged: NA          Social Determinants of Health (SDOH) Interventions     Readmission Risk Interventions No flowsheet data found.
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
## 2019-03-18 NOTE — Progress Notes (Signed)
Discharged Pt to [facility] in [facility] via [facility]. Report given to receiving RN in the facility.

## 2019-05-28 ENCOUNTER — Encounter (HOSPITAL_COMMUNITY): Payer: Self-pay

## 2019-05-28 ENCOUNTER — Emergency Department (HOSPITAL_COMMUNITY): Payer: Medicare Other

## 2019-05-28 ENCOUNTER — Emergency Department (HOSPITAL_COMMUNITY)
Admission: EM | Admit: 2019-05-28 | Discharge: 2019-05-28 | Disposition: A | Discharge status: Home / Self Care | Payer: Medicare Other | Attending: Emergency Medicine | Admitting: Emergency Medicine

## 2019-05-28 ENCOUNTER — Other Ambulatory Visit: Payer: Self-pay

## 2019-05-28 DIAGNOSIS — I10 Essential (primary) hypertension: Secondary | ICD-10-CM | POA: Insufficient documentation

## 2019-05-28 DIAGNOSIS — W19XXXA Unspecified fall, initial encounter: Secondary | ICD-10-CM

## 2019-05-28 DIAGNOSIS — J398 Other specified diseases of upper respiratory tract: Secondary | ICD-10-CM | POA: Insufficient documentation

## 2019-05-28 DIAGNOSIS — D649 Anemia, unspecified: Secondary | ICD-10-CM

## 2019-05-28 DIAGNOSIS — F17210 Nicotine dependence, cigarettes, uncomplicated: Secondary | ICD-10-CM | POA: Diagnosis not present

## 2019-05-28 DIAGNOSIS — F0390 Unspecified dementia without behavioral disturbance: Secondary | ICD-10-CM | POA: Diagnosis not present

## 2019-05-28 DIAGNOSIS — Y999 Unspecified external cause status: Secondary | ICD-10-CM | POA: Diagnosis not present

## 2019-05-28 DIAGNOSIS — R531 Weakness: Secondary | ICD-10-CM | POA: Diagnosis present

## 2019-05-28 DIAGNOSIS — W010XXA Fall on same level from slipping, tripping and stumbling without subsequent striking against object, initial encounter: Secondary | ICD-10-CM | POA: Insufficient documentation

## 2019-05-28 DIAGNOSIS — Y92129 Unspecified place in nursing home as the place of occurrence of the external cause: Secondary | ICD-10-CM | POA: Diagnosis not present

## 2019-05-28 DIAGNOSIS — I2510 Atherosclerotic heart disease of native coronary artery without angina pectoris: Secondary | ICD-10-CM | POA: Diagnosis not present

## 2019-05-28 DIAGNOSIS — Z79899 Other long term (current) drug therapy: Secondary | ICD-10-CM | POA: Diagnosis not present

## 2019-05-28 DIAGNOSIS — I252 Old myocardial infarction: Secondary | ICD-10-CM | POA: Insufficient documentation

## 2019-05-28 DIAGNOSIS — Z7982 Long term (current) use of aspirin: Secondary | ICD-10-CM | POA: Diagnosis not present

## 2019-05-28 DIAGNOSIS — D490 Neoplasm of unspecified behavior of digestive system: Secondary | ICD-10-CM | POA: Insufficient documentation

## 2019-05-28 DIAGNOSIS — Z955 Presence of coronary angioplasty implant and graft: Secondary | ICD-10-CM | POA: Insufficient documentation

## 2019-05-28 DIAGNOSIS — Y9389 Activity, other specified: Secondary | ICD-10-CM | POA: Insufficient documentation

## 2019-05-28 LAB — COMPREHENSIVE METABOLIC PANEL
ALT: 168 U/L — ABNORMAL HIGH (ref 0–44)
AST: 448 U/L — ABNORMAL HIGH (ref 15–41)
Albumin: 2.4 g/dL — ABNORMAL LOW (ref 3.5–5.0)
Alkaline Phosphatase: 539 U/L — ABNORMAL HIGH (ref 38–126)
Anion gap: 12 (ref 5–15)
BUN: 53 mg/dL — ABNORMAL HIGH (ref 8–23)
CO2: 18 mmol/L — ABNORMAL LOW (ref 22–32)
Calcium: 9.9 mg/dL (ref 8.9–10.3)
Chloride: 107 mmol/L (ref 98–111)
Creatinine, Ser: 1.35 mg/dL — ABNORMAL HIGH (ref 0.61–1.24)
GFR calc Af Amer: >60 mL/min (ref >60)
GFR calc non Af Amer: 53 mL/min — ABNORMAL LOW (ref >60)
Glucose, Bld: 83 mg/dL (ref 70–99)
Potassium: 4.9 mmol/L (ref 3.5–5.1)
Sodium: 137 mmol/L (ref 135–145)
Total Bilirubin: 3.2 mg/dL — ABNORMAL HIGH (ref 0.3–1.2)
Total Protein: 7.5 g/dL (ref 6.5–8.1)

## 2019-05-28 LAB — CBC WITH DIFFERENTIAL/PLATELET
Abs Immature Granulocytes: 0.06 10*3/uL (ref 0.00–0.07)
Basophils Absolute: 0.1 10*3/uL (ref 0.0–0.1)
Basophils Relative: 1 %
Eosinophils Absolute: 0 10*3/uL (ref 0.0–0.5)
Eosinophils Relative: 0 %
HCT: 31.3 % — ABNORMAL LOW (ref 39.0–52.0)
Hemoglobin: 10.3 g/dL — ABNORMAL LOW (ref 13.0–17.0)
Immature Granulocytes: 1 %
Lymphocytes Relative: 21 %
Lymphs Abs: 1.9 10*3/uL (ref 0.7–4.0)
MCH: 26.3 pg (ref 26.0–34.0)
MCHC: 32.9 g/dL (ref 30.0–36.0)
MCV: 80.1 fL (ref 80.0–100.0)
Monocytes Absolute: 0.6 10*3/uL (ref 0.1–1.0)
Monocytes Relative: 7 %
Neutro Abs: 6.2 10*3/uL (ref 1.7–7.7)
Neutrophils Relative %: 70 %
Platelets: 662 10*3/uL — ABNORMAL HIGH (ref 150–400)
RBC: 3.91 MIL/uL — ABNORMAL LOW (ref 4.22–5.81)
RDW: 16.4 % — ABNORMAL HIGH (ref 11.5–15.5)
WBC: 8.9 10*3/uL (ref 4.0–10.5)
nRBC: 0.6 % — ABNORMAL HIGH (ref 0.0–0.2)

## 2019-05-28 LAB — POC OCCULT BLOOD, ED: Fecal Occult Bld: NEGATIVE

## 2019-05-28 LAB — PROTIME-INR
INR: 1.2 (ref 0.8–1.2)
Prothrombin Time: 15.5 seconds — ABNORMAL HIGH (ref 11.4–15.2)

## 2019-05-28 LAB — CBG MONITORING, ED: Glucose-Capillary: 73 mg/dL (ref 70–99)

## 2019-05-28 MED ORDER — FERROUS SULFATE 325 (65 FE) MG PO TABS: 325.0000 mg | ORAL_TABLET | Freq: Every day | ORAL | 0 refills | Status: AC

## 2019-05-28 MED ORDER — DOCUSATE SODIUM 100 MG PO CAPS: 100.0000 mg | ORAL_CAPSULE | Freq: Two times a day (BID) | ORAL | 0 refills | Status: AC

## 2019-05-28 MED ORDER — SODIUM CHLORIDE 0.9 % IV SOLN: INTRAVENOUS | Status: DC

## 2019-05-28 MED ORDER — HYDROCODONE-ACETAMINOPHEN 5-325 MG PO TABS: 1.0000 | ORAL_TABLET | ORAL | 0 refills | Status: AC | PRN

## 2019-05-28 MED ORDER — HYDROCODONE-ACETAMINOPHEN 5-325 MG PO TABS
1.0000 | ORAL_TABLET | Freq: Once | ORAL | Status: AC
  Administered 2019-05-28: 17:00:00 1 via ORAL
  Filled 2019-05-28: qty 1

## 2019-05-28 MED ORDER — SODIUM CHLORIDE 0.9 % IV BOLUS
1000.0000 mL | Freq: Once | INTRAVENOUS | Status: AC
  Administered 2019-05-28: 1000 mL via INTRAVENOUS

## 2019-05-28 NOTE — ED Notes (Signed)
[facility] CATH PUT IN PLACE

## 2019-05-28 NOTE — ED Notes (Signed)
[facility] called for patient scheduled transfer back to [facility].

## 2019-05-28 NOTE — ED Triage Notes (Signed)
Patient BIB EMS from nursing home [facility]) with complaint of witnessed fall at approx 1210. Patient was seen "feeling weak, leaning up against the wall, then sliding down and hitting his head on the wall." Unknown LOC. Patient has history of Dementia and is AxOx1 at baseline. Patient in C-Collar per EMS. 18g Left hand PIV placed by EMS- no meds administered en route. Unremarkable 12-lead for EMS. Per report, patient usually ambulates with a walker, but "they took it away" at the nursing home. Unsure if patient still requires walker.
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
## 2019-05-28 NOTE — ED Notes (Signed)
Patient provided with [place] sandwich and apple juice per request.

## 2019-06-07 DEATH — deceased
